# Patient Record
Sex: Male | Born: 1960 | Race: Black or African American | Hispanic: No | Marital: Single | Smoking: Never smoker
Health system: Southern US, Community
[De-identification: ages and names within clinical notes are randomized; demographics above are authoritative.]

## PROBLEM LIST (undated history)

## (undated) DIAGNOSIS — G40909 Epilepsy, unspecified, not intractable, without status epilepticus: Secondary | ICD-10-CM

## (undated) DIAGNOSIS — H547 Unspecified visual loss: Secondary | ICD-10-CM

## (undated) DIAGNOSIS — G809 Cerebral palsy, unspecified: Secondary | ICD-10-CM

## (undated) DIAGNOSIS — R569 Unspecified convulsions: Secondary | ICD-10-CM

## (undated) DIAGNOSIS — F79 Unspecified intellectual disabilities: Secondary | ICD-10-CM

## (undated) HISTORY — PX: COLONOSCOPY: SHX174

## (undated) HISTORY — PX: NO PAST SURGERIES: SHX2092

## (undated) HISTORY — PX: DENTAL SURGERY: SHX609

---

## 2009-10-26 ENCOUNTER — Ambulatory Visit: Payer: Self-pay | Admitting: Gastroenterology

## 2010-02-01 ENCOUNTER — Inpatient Hospital Stay: Payer: Self-pay | Admitting: Specialist

## 2010-12-27 ENCOUNTER — Emergency Department: Payer: Self-pay | Admitting: Unknown Physician Specialty

## 2012-02-15 ENCOUNTER — Ambulatory Visit: Payer: Self-pay | Admitting: Nurse Practitioner

## 2012-11-22 ENCOUNTER — Ambulatory Visit: Payer: Self-pay | Admitting: Otolaryngology

## 2013-03-25 ENCOUNTER — Emergency Department: Payer: Self-pay | Admitting: Emergency Medicine

## 2013-03-25 LAB — URINALYSIS, COMPLETE
Bilirubin,UR: NEGATIVE
Glucose,UR: NEGATIVE mg/dL (ref 0–75)
Nitrite: NEGATIVE
Ph: 9 (ref 4.5–8.0)
Protein: 30
Squamous Epithelial: 1
WBC UR: 1 /HPF (ref 0–5)

## 2013-03-25 LAB — CBC
HCT: 45.4 % (ref 40.0–52.0)
HGB: 15.5 g/dL (ref 13.0–18.0)
MCH: 32.4 pg (ref 26.0–34.0)
MCHC: 34.2 g/dL (ref 32.0–36.0)
MCV: 95 fL (ref 80–100)
RBC: 4.8 10*6/uL (ref 4.40–5.90)

## 2013-03-25 LAB — DIFFERENTIAL
Basophil #: 0 10*3/uL (ref 0.0–0.1)
Basophil %: 0.4 %
Eosinophil #: 0 10*3/uL (ref 0.0–0.7)
Eosinophil %: 0.6 %
Lymphocyte #: 1 10*3/uL (ref 1.0–3.6)
Monocyte %: 6.8 %
Neutrophil %: 56.8 %

## 2013-03-25 LAB — COMPREHENSIVE METABOLIC PANEL
Bilirubin,Total: 0.3 mg/dL (ref 0.2–1.0)
EGFR (Non-African Amer.): >60
Glucose: 99 mg/dL (ref 65–99)
SGOT(AST): 40 U/L — ABNORMAL HIGH (ref 15–37)
SGPT (ALT): 31 U/L (ref 12–78)
Sodium: 141 mmol/L (ref 136–145)

## 2013-03-25 LAB — PHENYTOIN LEVEL, TOTAL: Dilantin: 15.4 ug/mL (ref 10.0–20.0)

## 2013-08-25 ENCOUNTER — Emergency Department: Payer: Self-pay | Admitting: Emergency Medicine

## 2013-08-25 LAB — COMPREHENSIVE METABOLIC PANEL
ALK PHOS: 76 U/L
AST: 17 U/L (ref 15–37)
Albumin: 4.1 g/dL (ref 3.4–5.0)
Anion Gap: 5 — ABNORMAL LOW (ref 7–16)
BILIRUBIN TOTAL: 0.4 mg/dL (ref 0.2–1.0)
BUN: 12 mg/dL (ref 7–18)
CREATININE: 0.9 mg/dL (ref 0.60–1.30)
Calcium, Total: 8.9 mg/dL (ref 8.5–10.1)
Chloride: 105 mmol/L (ref 98–107)
Co2: 29 mmol/L (ref 21–32)
Glucose: 112 mg/dL — ABNORMAL HIGH (ref 65–99)
Osmolality: 278 (ref 275–301)
POTASSIUM: 3.7 mmol/L (ref 3.5–5.1)
SGPT (ALT): 25 U/L (ref 12–78)
Sodium: 139 mmol/L (ref 136–145)
Total Protein: 7.7 g/dL (ref 6.4–8.2)

## 2013-08-25 LAB — PHENYTOIN LEVEL, TOTAL: DILANTIN: 18.5 ug/mL (ref 10.0–20.0)

## 2013-08-25 LAB — CBC
HCT: 47.1 % (ref 40.0–52.0)
HGB: 15.1 g/dL (ref 13.0–18.0)
MCH: 30.8 pg (ref 26.0–34.0)
MCHC: 32 g/dL (ref 32.0–36.0)
MCV: 96 fL (ref 80–100)
Platelet: 182 10*3/uL (ref 150–440)
RBC: 4.9 10*6/uL (ref 4.40–5.90)
RDW: 13.3 % (ref 11.5–14.5)
WBC: 5.1 10*3/uL (ref 3.8–10.6)

## 2013-08-26 LAB — URINALYSIS, COMPLETE
BILIRUBIN, UR: NEGATIVE
Bacteria: NONE SEEN
GLUCOSE, UR: NEGATIVE mg/dL (ref 0–75)
Leukocyte Esterase: NEGATIVE
Nitrite: NEGATIVE
PH: 5 (ref 4.5–8.0)
SQUAMOUS EPITHELIAL: NONE SEEN
Specific Gravity: 1.032 (ref 1.003–1.030)
WBC UR: <1 /HPF (ref 0–5)

## 2014-02-24 ENCOUNTER — Emergency Department: Payer: Self-pay | Admitting: Emergency Medicine

## 2014-02-25 LAB — BASIC METABOLIC PANEL
Anion Gap: 2 — ABNORMAL LOW (ref 7–16)
BUN: 16 mg/dL (ref 7–18)
CREATININE: 0.94 mg/dL (ref 0.60–1.30)
Calcium, Total: 8.9 mg/dL (ref 8.5–10.1)
Chloride: 104 mmol/L (ref 98–107)
Co2: 34 mmol/L — ABNORMAL HIGH (ref 21–32)
GLUCOSE: 120 mg/dL — AB (ref 65–99)
Osmolality: 282 (ref 275–301)
Potassium: 3.7 mmol/L (ref 3.5–5.1)
SODIUM: 140 mmol/L (ref 136–145)

## 2014-02-25 LAB — URINALYSIS, COMPLETE
BILIRUBIN, UR: NEGATIVE
Blood: NEGATIVE
Glucose,UR: NEGATIVE mg/dL (ref 0–75)
Ketone: NEGATIVE
Leukocyte Esterase: NEGATIVE
Nitrite: NEGATIVE
Ph: 8 (ref 4.5–8.0)
Protein: NEGATIVE
RBC,UR: 4 /HPF (ref 0–5)
SPECIFIC GRAVITY: 1.014 (ref 1.003–1.030)
SQUAMOUS EPITHELIAL: NONE SEEN
WBC UR: NONE SEEN /HPF (ref 0–5)

## 2014-02-25 LAB — CBC
HCT: 45.3 % (ref 40.0–52.0)
HGB: 15.1 g/dL (ref 13.0–18.0)
MCH: 32 pg (ref 26.0–34.0)
MCHC: 33.4 g/dL (ref 32.0–36.0)
MCV: 96 fL (ref 80–100)
PLATELETS: 192 10*3/uL (ref 150–440)
RBC: 4.72 10*6/uL (ref 4.40–5.90)
RDW: 13.4 % (ref 11.5–14.5)
WBC: 8.1 10*3/uL (ref 3.8–10.6)

## 2014-02-25 LAB — PHENYTOIN LEVEL, TOTAL: DILANTIN: 15.5 ug/mL (ref 10.0–20.0)

## 2019-03-01 ENCOUNTER — Encounter
Admission: RE | Admit: 2019-03-01 | Discharge: 2019-03-01 | Disposition: A | Discharge status: Home / Self Care | Payer: Medicare Other | Source: Ambulatory Visit | Attending: Internal Medicine | Admitting: Internal Medicine

## 2019-03-01 ENCOUNTER — Other Ambulatory Visit: Payer: Self-pay

## 2019-03-01 DIAGNOSIS — Z20828 Contact with and (suspected) exposure to other viral communicable diseases: Secondary | ICD-10-CM | POA: Diagnosis not present

## 2019-03-01 DIAGNOSIS — Z01812 Encounter for preprocedural laboratory examination: Secondary | ICD-10-CM | POA: Diagnosis present

## 2019-03-01 LAB — SARS CORONAVIRUS 2 (TAT 6-24 HRS): SARS Coronavirus 2: NEGATIVE

## 2019-03-05 ENCOUNTER — Encounter: Payer: Self-pay | Admitting: *Deleted

## 2019-03-06 ENCOUNTER — Ambulatory Visit
Admission: RE | Admit: 2019-03-06 | Discharge: 2019-03-06 | Disposition: A | Discharge status: Home / Self Care | Payer: Medicare Other | Source: Ambulatory Visit | Attending: Internal Medicine | Admitting: Internal Medicine

## 2019-03-06 ENCOUNTER — Ambulatory Visit: Payer: Medicare Other | Admitting: Registered Nurse

## 2019-03-06 ENCOUNTER — Encounter
Admission: RE | Disposition: A | Discharge status: Home / Self Care | Payer: Self-pay | Source: Ambulatory Visit | Attending: Internal Medicine

## 2019-03-06 ENCOUNTER — Encounter: Payer: Self-pay | Admitting: *Deleted

## 2019-03-06 DIAGNOSIS — Z1211 Encounter for screening for malignant neoplasm of colon: Secondary | ICD-10-CM | POA: Insufficient documentation

## 2019-03-06 DIAGNOSIS — Z79899 Other long term (current) drug therapy: Secondary | ICD-10-CM | POA: Diagnosis not present

## 2019-03-06 DIAGNOSIS — G809 Cerebral palsy, unspecified: Secondary | ICD-10-CM | POA: Insufficient documentation

## 2019-03-06 DIAGNOSIS — G40909 Epilepsy, unspecified, not intractable, without status epilepticus: Secondary | ICD-10-CM | POA: Insufficient documentation

## 2019-03-06 HISTORY — DX: Cerebral palsy, unspecified: G80.9

## 2019-03-06 HISTORY — PX: COLONOSCOPY WITH PROPOFOL: SHX5780

## 2019-03-06 HISTORY — DX: Unspecified convulsions: R56.9

## 2019-03-06 HISTORY — DX: Epilepsy, unspecified, not intractable, without status epilepticus: G40.909

## 2019-03-06 HISTORY — DX: Unspecified visual loss: H54.7

## 2019-03-06 HISTORY — DX: Unspecified intellectual disabilities: F79

## 2019-03-06 SURGERY — COLONOSCOPY WITH PROPOFOL
Anesthesia: General

## 2019-03-06 MED ORDER — PROPOFOL 10 MG/ML IV BOLUS
INTRAVENOUS | Status: DC | PRN
  Administered 2019-03-06: 70 mg via INTRAVENOUS

## 2019-03-06 MED ORDER — EPHEDRINE SULFATE 50 MG/ML IJ SOLN
INTRAMUSCULAR | Status: DC | PRN
  Administered 2019-03-06: 10 mg via INTRAVENOUS

## 2019-03-06 MED ORDER — PROPOFOL 500 MG/50ML IV EMUL
INTRAVENOUS | Status: AC
  Filled 2019-03-06: qty 50

## 2019-03-06 MED ORDER — LIDOCAINE HCL (CARDIAC) PF 100 MG/5ML IV SOSY
PREFILLED_SYRINGE | INTRAVENOUS | Status: DC | PRN
  Administered 2019-03-06: 40 mg via INTRAVENOUS

## 2019-03-06 MED ORDER — SODIUM CHLORIDE 0.9 % IV SOLN
INTRAVENOUS | Status: DC
  Administered 2019-03-06: 13:00:00 via INTRAVENOUS

## 2019-03-06 MED ORDER — PROPOFOL 500 MG/50ML IV EMUL
INTRAVENOUS | Status: DC | PRN
  Administered 2019-03-06: 150 ug/kg/min via INTRAVENOUS

## 2019-03-06 NOTE — Op Note (Signed)
[facility] Gastroenterology Patient Name: Darius King Procedure Date: 03/06/2019 12:34 PM MRN: [id number] Account #: [id number] Date of Birth: 01-Mar-1961 Admit Type: Outpatient Age: 58 Room: [facility] ENDO ROOM 3 Gender: Male Note Status: Finalized Procedure:            Colonoscopy Indications:          Screening for colorectal malignant neoplasm Providers:            Boykin Nearing. Brier Firebaugh MD, MD Medicines:            Propofol per Anesthesia Complications:        No immediate complications. Procedure:            Pre-Anesthesia Assessment:                       - The risks and benefits of the procedure and the                        sedation options and risks were discussed with the                        patient. All questions were answered and informed                        consent was obtained.                       - Patient identification and proposed procedure were                        verified prior to the procedure by the nurse. The                        procedure was verified in the procedure room.                       - ASA Grade Assessment: III - A patient with severe                        systemic disease.                       - After reviewing the risks and benefits, the patient                        was deemed in satisfactory condition to undergo the                        procedure.                       After obtaining informed consent, the colonoscope was                        passed under direct vision. Throughout the procedure,                        the patient's blood pressure, pulse, and oxygen                        saturations were monitored continuously. The  Colonoscope was introduced through the anus and                        advanced to the the cecum, identified by appendiceal                        orifice and ileocecal valve. The colonoscopy was                        performed without difficulty. The patient  tolerated the                        procedure well. The quality of the bowel preparation                        was good except the descending colon was fair. The                        ileocecal valve, appendiceal orifice, and rectum were                        photographed. Findings:      The perianal and digital rectal examinations were normal. Pertinent       negatives include normal sphincter tone and no palpable rectal lesions.      Semi-liquid stool was found in the sigmoid colon and in the descending       colon, interfering with visualization. Lavage of the area was performed       using a moderate amount of tap water, resulting in clearance with fair       visualization.      The exam was otherwise without abnormality on direct and retroflexion       views. Impression:           - Stool in the sigmoid colon and in the descending                        colon.                       - The examination was otherwise normal on direct and                        retroflexion views.                       - No specimens collected. Recommendation:       - Patient has a contact number available for                        emergencies. The signs and symptoms of potential                        delayed complications were discussed with the patient.                        Return to normal activities tomorrow. Written discharge                        instructions were provided to the patient.                       -  Resume previous diet.                       - Continue present medications.                       - Repeat colonoscopy in 5 years for screening purposes.                       - Return to GI office PRN.                       - The findings and recommendations were discussed with                        the patient's family. Procedure Code(s):    --- Professional ---                       X9147, Colorectal cancer screening; colonoscopy on                        individual not  meeting criteria for high risk Diagnosis Code(s):    --- Professional ---                       Z12.11, Encounter for screening for malignant neoplasm                        of colon CPT copyright 2019 [employer]. All rights reserved. The codes documented in this report are preliminary and upon coder review may  be revised to meet current compliance requirements. Stanton Kidney MD, MD 03/06/2019 1:29:35 PM This report has been signed electronically. Number of Addenda: 0 Note Initiated On: 03/06/2019 12:34 PM Scope Withdrawal Time: 0 hours 9 minutes 56 seconds  Total Procedure Duration: 0 hours 17 minutes 27 seconds  Estimated Blood Loss: Estimated blood loss: none.      [facility]

## 2019-03-06 NOTE — Anesthesia Preprocedure Evaluation (Signed)
Anesthesia Evaluation  Patient identified by MRN, date of birth, ID band Patient awake and Patient confused    Reviewed: Allergy & Precautions, H&P , NPO status , Patient's Chart, lab work & pertinent test results  History of Anesthesia Complications Negative for: history of anesthetic complications  Airway Mallampati: III  TM Distance: >3 FB Neck ROM: limited    Dental  (+) Chipped, Poor Dentition, Missing   Pulmonary neg pulmonary ROS,           Cardiovascular (-) Past MI negative cardio ROS       Neuro/Psych Seizures -, Poorly Controlled,  PSYCHIATRIC DISORDERS    GI/Hepatic negative GI ROS, Neg liver ROS,   Endo/Other  negative endocrine ROS  Renal/GU negative Renal ROS  negative genitourinary   Musculoskeletal   Abdominal   Peds  Hematology negative hematology ROS (+)   Anesthesia Other Findings Past Medical History: No date: Blindness No date: Cerebral palsy ([name]) No date: Epilepsy ([place]) No date: Mentally challenged No date: Seizures ([place])  Past Surgical History: No date: COLONOSCOPY No date: DENTAL SURGERY No date: NO PAST SURGERIES  BMI    Body Mass Index: 29.29 kg/m      Reproductive/Obstetrics negative OB ROS                             Anesthesia Physical Anesthesia Plan  ASA: III  Anesthesia Plan: General   Post-op Pain Management:    Induction: Intravenous  PONV Risk Score and Plan: Propofol infusion and TIVA  Airway Management Planned: Natural Airway and Nasal Cannula  Additional Equipment:   Intra-op Plan:   Post-operative Plan:   Informed Consent: I have reviewed the patients History and Physical, chart, labs and discussed the procedure including the risks, benefits and alternatives for the proposed anesthesia with the patient or authorized representative who has indicated his/her understanding and acceptance.     Dental Advisory  Given  Plan Discussed with: Anesthesiologist, CRNA and Surgeon  Anesthesia Plan Comments: (Sister consented for risks of anesthesia including but not limited to:  - adverse reactions to medications - risk of intubation if required - damage to teeth, lips or other oral mucosa - sore throat or hoarseness - Damage to heart, brain, lungs or loss of life  Sister voiced understanding.)        Anesthesia Quick Evaluation

## 2019-03-06 NOTE — H&P (Signed)
Outpatient short stay form Pre-procedure 03/06/2019 10:37 AM Darius King, M.D.  Primary Physician: Salley Scarlet, M.D.  Reason for visit:  Colon cancer screening  History of present illness:  Patient is a 58 y/o male whose NOK is his sister. He has cerebral palsy with mental retardation and is unable to make his own decisions or discuss procedures. The sister gave consent to perform the colonoscopy scheduled today. The patient's sister understood the nature of the planned procedure, indications, risks, alternatives and potential complications including but not limited to bleeding, infection, perforation, damage to internal organs and possible oversedation/side effects from anesthesia.  She agrees and gives consent to proceed.  Please refer to procedure notes for findings, recommendations and patient disposition/instructions.   No current facility-administered medications for this encounter.   Current Outpatient Medications:  .  folic acid (FOLVITE) 1 MG tablet, Take 1 mg by mouth daily., Disp: , Rfl:  .  hydrocortisone 2.5 % cream, Apply topically 2 (two) times daily., Disp: , Rfl:  .  hydrocortisone 2.5 % cream, Apply topically 3 (three) times daily. Apply after bm, Disp: , Rfl:  .  lamoTRIgine (LAMICTAL) 100 MG tablet, Take 200 mg by mouth daily. and 250 mg at night, Disp: , Rfl:  .  LORazepam (ATIVAN) 1 MG tablet, Take 1 mg by mouth once as needed for anxiety (seizure)., Disp: , Rfl:  .  lovastatin (MEVACOR) 20 MG tablet, Take 20 mg by mouth daily., Disp: , Rfl:  .  phenytoin (DILANTIN) 100 MG ER capsule, Take by mouth 2 (two) times daily., Disp: , Rfl:   No medications prior to admission.     Not on File   Past Medical History:  Diagnosis Date  . Blindness   . Cerebral palsy ([place])   . Epilepsy ([place])   . Mentally challenged   . Seizures ([place])     Review of systems:  Otherwise negative.    Physical Exam  Gen: Alert, oriented. Appears stated age.  HEENT:  [place]/AT. PERRLA. Lungs: CTA, no wheezes. CV: RR nl S1, S2. Abd: soft, benign, no masses. BS+ Ext: No edema. Pulses 2+    Planned procedures: Proceed with colonoscopy. The patient understands the nature of the planned procedure, indications, risks, alternatives and potential complications including but not limited to bleeding, infection, perforation, damage to internal organs and possible oversedation/side effects from anesthesia. The patient agrees and gives consent to proceed.  Please refer to procedure notes for findings, recommendations and patient disposition/instructions.     Darius King, M.D. Gastroenterology 03/06/2019  10:37 AM

## 2019-03-06 NOTE — Transfer of Care (Signed)
Immediate Anesthesia Transfer of Care Note  Patient: Darius King  Procedure(s) Performed: Procedure(s): COLONOSCOPY WITH PROPOFOL (N/A)  Patient Location: PACU and Endoscopy Unit  Anesthesia Type:General  Level of Consciousness: sedated  Airway & Oxygen Therapy: Patient Spontanous Breathing and Patient connected to nasal cannula oxygen  Post-op Assessment: Report given to RN and Post -op Vital signs reviewed and stable  Post vital signs: Reviewed and stable  Last Vitals:  Vitals:   03/06/19 1330 03/06/19 1331  BP: (!) 79/47 104/67  Pulse: 76 78  Resp: 18 17  Temp:    SpO2: 62% 03%    Complications: No apparent anesthesia complications

## 2019-03-06 NOTE — Anesthesia Procedure Notes (Signed)
Date/Time: 03/06/2019 1:00 PM Performed by: Doreen Salvage, CRNA Pre-anesthesia Checklist: Patient identified, Emergency Drugs available, Suction available and Patient being monitored Patient Re-evaluated:Patient Re-evaluated prior to induction Oxygen Delivery Method: Nasal cannula Induction Type: IV induction Dental Injury: Teeth and Oropharynx as per pre-operative assessment  Comments: Nasal cannula with etCO2 monitoring

## 2019-03-06 NOTE — Anesthesia Post-op Follow-up Note (Signed)
Anesthesia QCDR form completed.        

## 2019-03-07 ENCOUNTER — Encounter: Payer: Self-pay | Admitting: Internal Medicine

## 2019-03-07 NOTE — Anesthesia Postprocedure Evaluation (Signed)
Anesthesia Post Note  Patient: Darius King  Procedure(s) Performed: COLONOSCOPY WITH PROPOFOL (N/A )  Patient location during evaluation: Endoscopy Anesthesia Type: General Level of consciousness: awake and alert Pain management: pain level controlled Vital Signs Assessment: post-procedure vital signs reviewed and stable Respiratory status: spontaneous breathing, nonlabored ventilation, respiratory function stable and patient connected to nasal cannula oxygen Cardiovascular status: blood pressure returned to baseline and stable Postop Assessment: no apparent nausea or vomiting Anesthetic complications: no     Last Vitals:  Vitals:   03/06/19 1331 03/06/19 1340  BP: 104/67 120/71  Pulse: 78 79  Resp: 17 18  Temp:  (!) 36.4 C  SpO2: 99% 100%    Last Pain:  Vitals:   03/06/19 1340  TempSrc: Tympanic                 [name]

## 2021-02-19 ENCOUNTER — Other Ambulatory Visit: Payer: Self-pay | Admitting: Neurology

## 2021-02-19 DIAGNOSIS — R569 Unspecified convulsions: Secondary | ICD-10-CM

## 2021-03-15 ENCOUNTER — Other Ambulatory Visit: Payer: Self-pay

## 2021-03-15 ENCOUNTER — Ambulatory Visit
Admission: RE | Admit: 2021-03-15 | Discharge: 2021-03-15 | Disposition: A | Discharge status: Home / Self Care | Payer: Medicare Other | Source: Ambulatory Visit | Attending: Neurology | Admitting: Neurology

## 2021-03-15 DIAGNOSIS — R569 Unspecified convulsions: Secondary | ICD-10-CM | POA: Insufficient documentation

## 2023-05-05 IMAGING — CT CT HEAD W/O CM
1 series · 15 of 28 positions shown, 19 images · non-contrast
Comparison: CT head without contrast 08/26/2013.

CLINICAL DATA: Seizures

EXAM:
CT HEAD WITHOUT CONTRAST
TECHNIQUE: Contiguous axial images were obtained from the base of the skull
through the vertex without intravenous contrast.

[Series 2: head wo · axial · 0.39mm/px · z∈[-98,+27]mm · 15 of 28 slices shown, 19 images]
[im 2/28  brain]
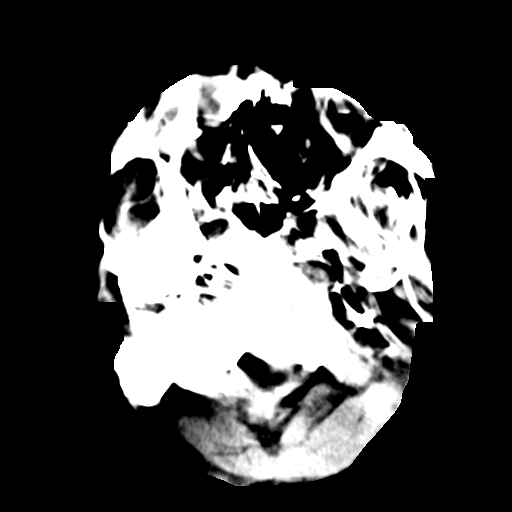
[im 2/28  bone]
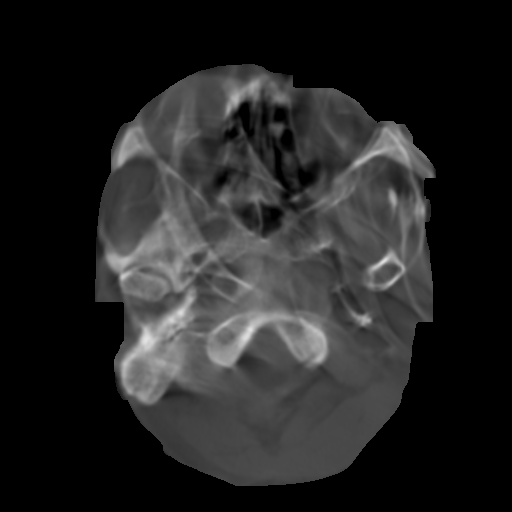
[im 4/28  brain]
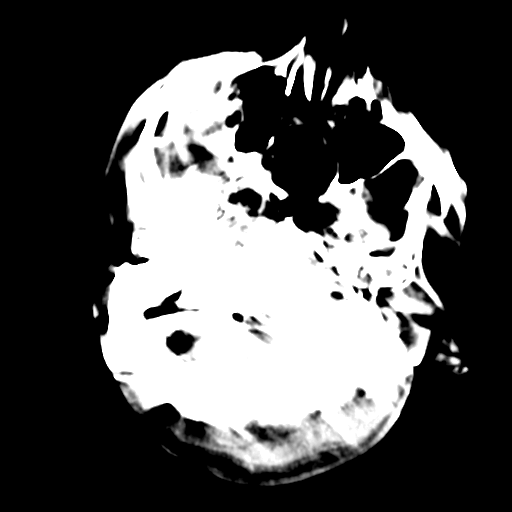
[im 6/28  brain]
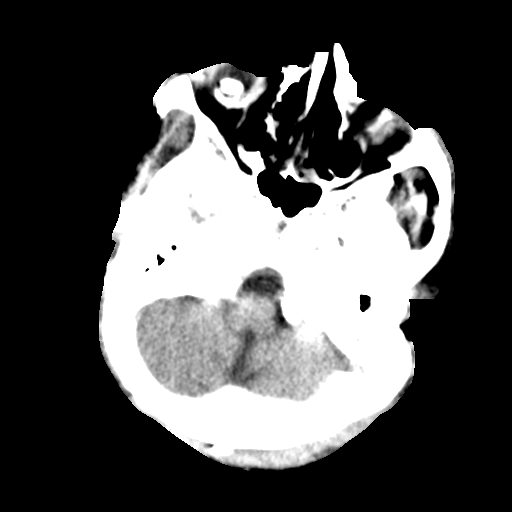
[im 8/28  brain]
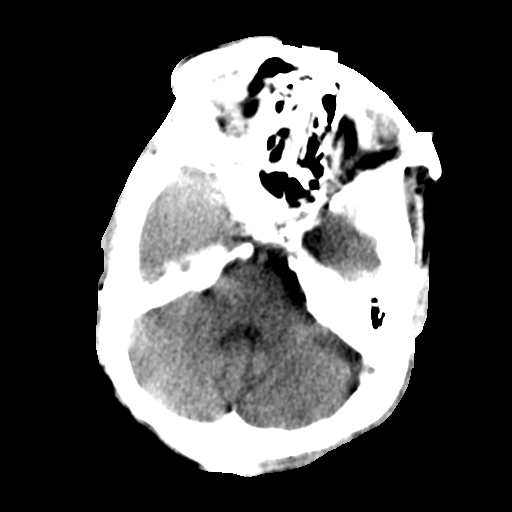
[im 9/28  brain]
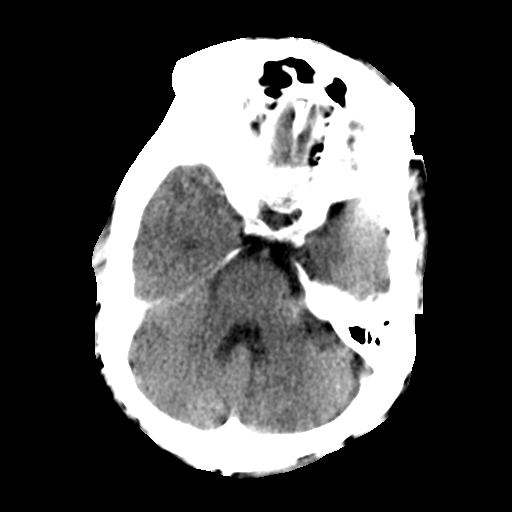
[im 9/28  bone]
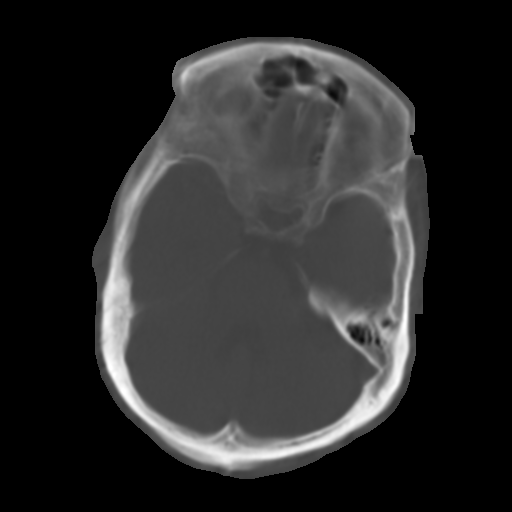
[im 11/28  brain]
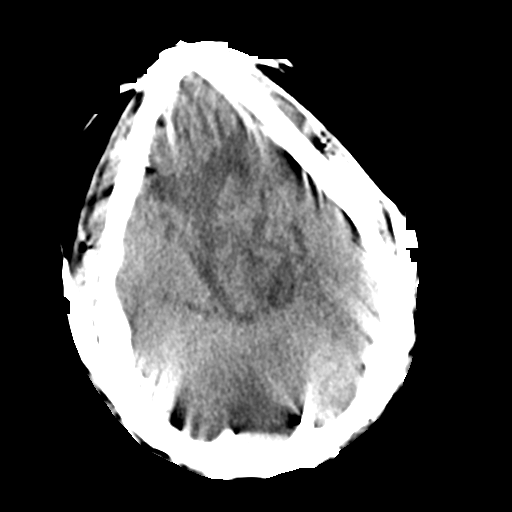
[im 13/28  brain]
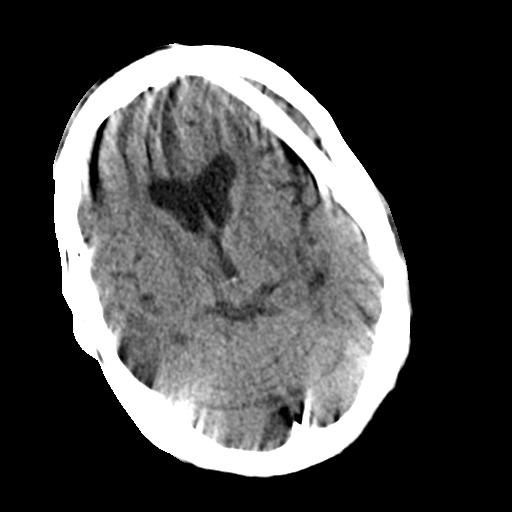
[im 15/28  brain]
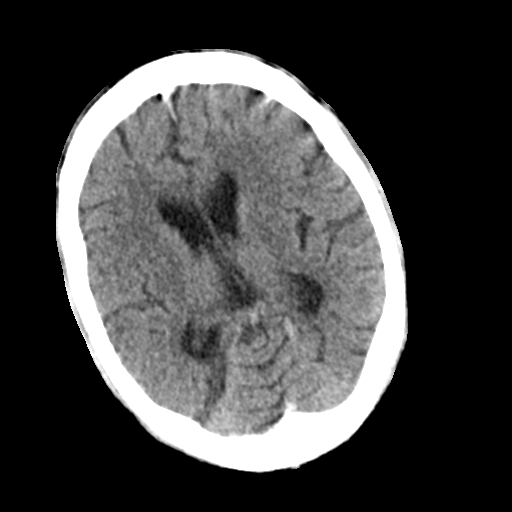
[im 16/28  brain]
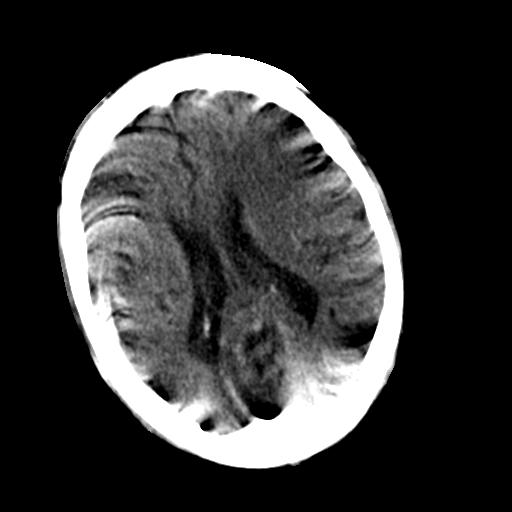
[im 16/28  bone]
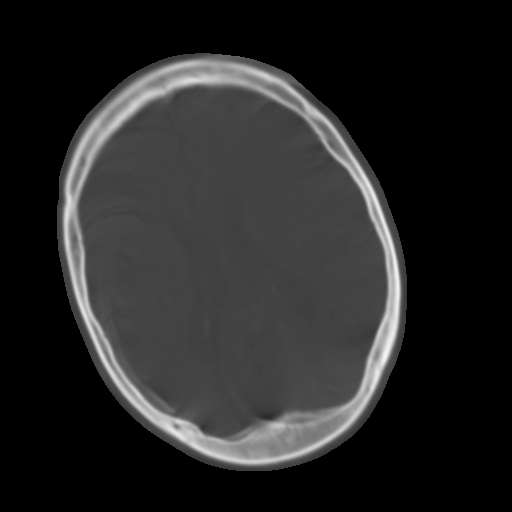
[im 18/28  brain]
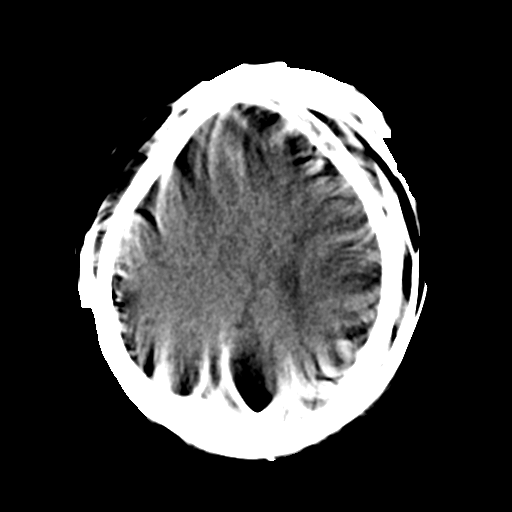
[im 20/28  brain]
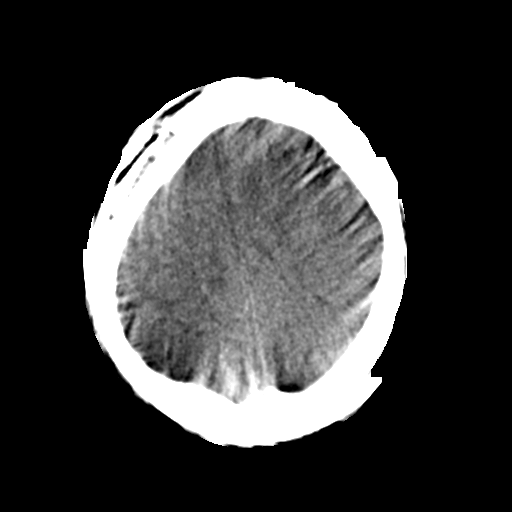
[im 21/28  brain]
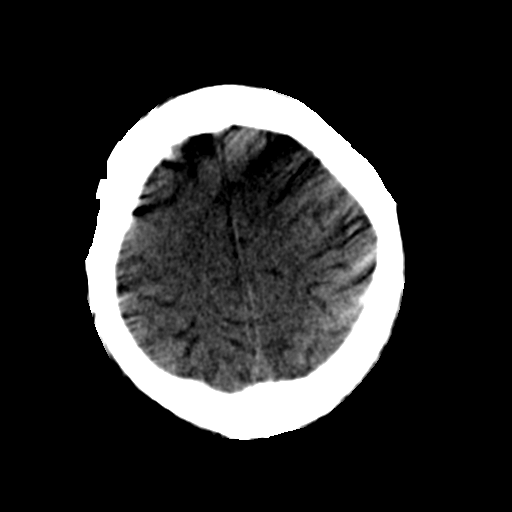
[im 23/28  brain]
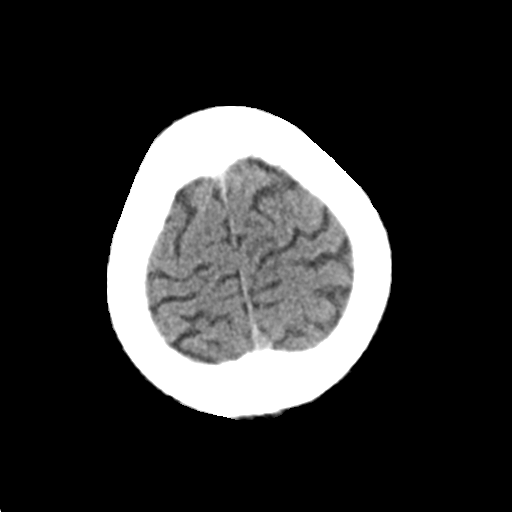
[im 23/28  bone]
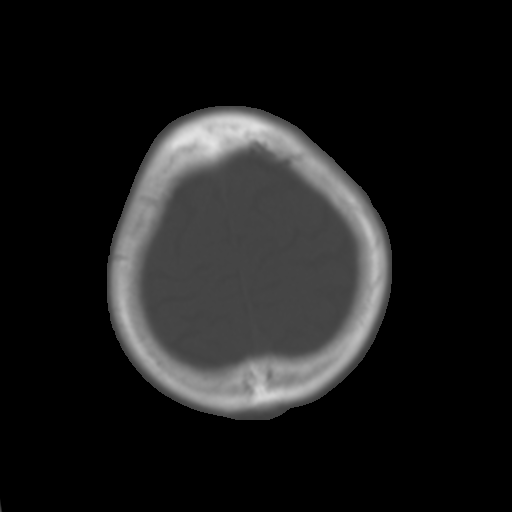
[im 25/28  brain]
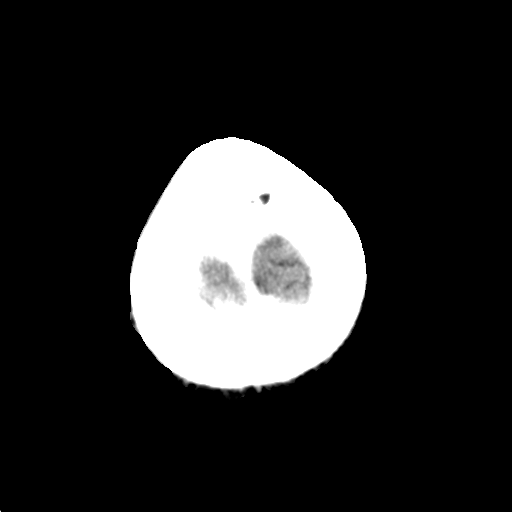
[im 27/28  brain]
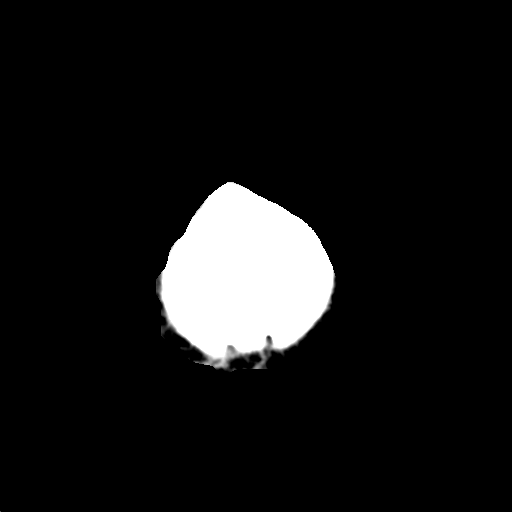

[15 of 28 positions shown; findings below may reference images not displayed]

FINDINGS: Brain: Despite medication immobilization attempts, there is
significant movement throughout the exam. Significant portions the
brain are poorly visualized as a result. Patient could not tolerate
repeat images.

Visualized portions the brain are unremarkable. No acute infarct,
hemorrhage, or mass lesion is present. Ventricles are of normal
size. No significant extra-axial fluid collection is present.
Brainstem and cerebellum are unremarkable.

Vascular: No hyperdense vessel or unexpected calcification.

Skull: Acute or focal abnormalities are evident. Visualization is
degraded by motion. No significant extracranial soft tissue lesion
is present.

Sinuses/Orbits: Calcifications are noted in the globes bilaterally.
Mucosal thickening is present throughout the posterior ethmoid air
cells. A small fluid level is present in the right maxillary sinus.
No other fluid levels are present.
IMPRESSION: 1. Significantly motion degraded exam.
2. Visible portions of the brain are unremarkable. No acute or focal
lesion to seizures.
3. Mild sinus disease.

## 2023-08-02 ENCOUNTER — Emergency Department: Payer: Medicare Other

## 2023-08-02 ENCOUNTER — Inpatient Hospital Stay
Admission: EM | Admit: 2023-08-02 | Discharge: 2023-08-06 | DRG: 177 | Disposition: A | Discharge status: Home / Self Care | Payer: Medicare Other | Attending: Internal Medicine | Admitting: Internal Medicine

## 2023-08-02 ENCOUNTER — Other Ambulatory Visit: Payer: Self-pay

## 2023-08-02 DIAGNOSIS — E785 Hyperlipidemia, unspecified: Secondary | ICD-10-CM | POA: Diagnosis present

## 2023-08-02 DIAGNOSIS — J101 Influenza due to other identified influenza virus with other respiratory manifestations: Secondary | ICD-10-CM

## 2023-08-02 DIAGNOSIS — G809 Cerebral palsy, unspecified: Secondary | ICD-10-CM | POA: Diagnosis present

## 2023-08-02 DIAGNOSIS — Z79899 Other long term (current) drug therapy: Secondary | ICD-10-CM

## 2023-08-02 DIAGNOSIS — J9601 Acute respiratory failure with hypoxia: Secondary | ICD-10-CM | POA: Diagnosis present

## 2023-08-02 DIAGNOSIS — J690 Pneumonitis due to inhalation of food and vomit: Secondary | ICD-10-CM | POA: Diagnosis present

## 2023-08-02 DIAGNOSIS — I700 Atherosclerosis of aorta: Secondary | ICD-10-CM | POA: Diagnosis present

## 2023-08-02 DIAGNOSIS — A419 Sepsis, unspecified organism: Principal | ICD-10-CM

## 2023-08-02 DIAGNOSIS — G40909 Epilepsy, unspecified, not intractable, without status epilepticus: Secondary | ICD-10-CM

## 2023-08-02 LAB — CBC WITH DIFFERENTIAL/PLATELET
Abs Immature Granulocytes: 0.03 10*3/uL (ref 0.00–0.07)
Basophils Absolute: 0 10*3/uL (ref 0.0–0.1)
Basophils Relative: 0 %
Eosinophils Absolute: 0 10*3/uL (ref 0.0–0.5)
Eosinophils Relative: 0 %
HCT: 42.4 % (ref 39.0–52.0)
Hemoglobin: 14.4 g/dL (ref 13.0–17.0)
Immature Granulocytes: 1 %
Lymphocytes Relative: 7 %
Lymphs Abs: 0.4 10*3/uL — ABNORMAL LOW (ref 0.7–4.0)
MCH: 32.5 pg (ref 26.0–34.0)
MCHC: 34 g/dL (ref 30.0–36.0)
MCV: 95.7 fL (ref 80.0–100.0)
Monocytes Absolute: 0.7 10*3/uL (ref 0.1–1.0)
Monocytes Relative: 11 %
Neutro Abs: 5.1 10*3/uL (ref 1.7–7.7)
Neutrophils Relative %: 81 %
Platelets: 175 10*3/uL (ref 150–400)
RBC: 4.43 MIL/uL (ref 4.22–5.81)
RDW: 12.9 % (ref 11.5–15.5)
WBC: 6.2 10*3/uL (ref 4.0–10.5)
nRBC: 0 % (ref 0.0–0.2)

## 2023-08-02 LAB — BASIC METABOLIC PANEL
Anion gap: 11 (ref 5–15)
BUN: 23 mg/dL (ref 8–23)
CO2: 24 mmol/L (ref 22–32)
Calcium: 8.8 mg/dL — ABNORMAL LOW (ref 8.9–10.3)
Chloride: 106 mmol/L (ref 98–111)
Creatinine, Ser: 0.88 mg/dL (ref 0.61–1.24)
GFR, Estimated: >60 mL/min (ref >60)
Glucose, Bld: 147 mg/dL — ABNORMAL HIGH (ref 70–99)
Potassium: 3.9 mmol/L (ref 3.5–5.1)
Sodium: 141 mmol/L (ref 135–145)

## 2023-08-02 LAB — HEPATIC FUNCTION PANEL
ALT: 21 U/L (ref 0–44)
AST: 31 U/L (ref 15–41)
Albumin: 4.1 g/dL (ref 3.5–5.0)
Alkaline Phosphatase: 58 U/L (ref 38–126)
Bilirubin, Direct: <0.1 mg/dL (ref 0.0–0.2)
Total Bilirubin: 0.5 mg/dL (ref 0.0–1.2)
Total Protein: 7 g/dL (ref 6.5–8.1)

## 2023-08-02 LAB — RESP PANEL BY RT-PCR (RSV, FLU A&B, COVID)  RVPGX2
Influenza A by PCR: POSITIVE — AB
Influenza B by PCR: NEGATIVE
Resp Syncytial Virus by PCR: NEGATIVE
SARS Coronavirus 2 by RT PCR: NEGATIVE

## 2023-08-02 LAB — LACTIC ACID, PLASMA: Lactic Acid, Venous: 1.8 mmol/L (ref 0.5–1.9)

## 2023-08-02 LAB — PHENYTOIN LEVEL, TOTAL: Phenytoin Lvl: 21.7 ug/mL — ABNORMAL HIGH (ref 10.0–20.0)

## 2023-08-02 MED ORDER — FOLIC ACID 1 MG PO TABS
1.0000 mg | ORAL_TABLET | Freq: Every day | ORAL | Status: DC
  Administered 2023-08-03 – 2023-08-06 (×4): 1 mg via ORAL
  Filled 2023-08-02 (×4): qty 1

## 2023-08-02 MED ORDER — MAGNESIUM HYDROXIDE 400 MG/5ML PO SUSP
30.0000 mL | Freq: Every day | ORAL | Status: DC | PRN
Start: 2023-08-02 — End: 2023-08-06

## 2023-08-02 MED ORDER — SODIUM CHLORIDE 0.9 % IV SOLN: 2.0000 g | INTRAVENOUS | Status: DC

## 2023-08-02 MED ORDER — PHENYTOIN SODIUM EXTENDED 100 MG PO CAPS
100.0000 mg | ORAL_CAPSULE | Freq: Two times a day (BID) | ORAL | Status: DC
Start: 2023-08-02 — End: 2023-08-06
  Administered 2023-08-03 – 2023-08-06 (×7): 100 mg via ORAL
  Filled 2023-08-02 (×7): qty 1

## 2023-08-02 MED ORDER — ACETAMINOPHEN 10 MG/ML IV SOLN
1000.0000 mg | Freq: Once | INTRAVENOUS | Status: AC
  Administered 2023-08-03: 1000 mg via INTRAVENOUS
  Filled 2023-08-02: qty 100

## 2023-08-02 MED ORDER — OSELTAMIVIR PHOSPHATE 75 MG PO CAPS
75.0000 mg | ORAL_CAPSULE | Freq: Two times a day (BID) | ORAL | Status: DC
  Administered 2023-08-03 – 2023-08-06 (×8): 75 mg via ORAL
  Filled 2023-08-02 (×8): qty 1

## 2023-08-02 MED ORDER — ONDANSETRON HCL 4 MG PO TABS: 4.0000 mg | ORAL_TABLET | Freq: Four times a day (QID) | ORAL | Status: DC | PRN

## 2023-08-02 MED ORDER — ENOXAPARIN SODIUM 40 MG/0.4ML IJ SOSY
40.0000 mg | PREFILLED_SYRINGE | INTRAMUSCULAR | Status: DC
  Administered 2023-08-03 – 2023-08-05 (×3): 40 mg via SUBCUTANEOUS
  Filled 2023-08-02 (×3): qty 0.4

## 2023-08-02 MED ORDER — METRONIDAZOLE 500 MG/100ML IV SOLN
500.0000 mg | Freq: Once | INTRAVENOUS | Status: AC
  Administered 2023-08-02: 500 mg via INTRAVENOUS
  Filled 2023-08-02: qty 100

## 2023-08-02 MED ORDER — METRONIDAZOLE 500 MG/100ML IV SOLN: 500.0000 mg | Freq: Two times a day (BID) | INTRAVENOUS | Status: DC

## 2023-08-02 MED ORDER — IPRATROPIUM-ALBUTEROL 0.5-2.5 (3) MG/3ML IN SOLN
3.0000 mL | Freq: Four times a day (QID) | RESPIRATORY_TRACT | Status: DC
  Administered 2023-08-02 – 2023-08-04 (×7): 3 mL via RESPIRATORY_TRACT
  Filled 2023-08-02 (×7): qty 3

## 2023-08-02 MED ORDER — SODIUM CHLORIDE 0.9 % IV SOLN
500.0000 mg | Freq: Once | INTRAVENOUS | Status: AC
  Administered 2023-08-02: 500 mg via INTRAVENOUS
  Filled 2023-08-02: qty 5

## 2023-08-02 MED ORDER — ACETAMINOPHEN 325 MG PO TABS: 650.0000 mg | ORAL_TABLET | Freq: Four times a day (QID) | ORAL | Status: DC | PRN

## 2023-08-02 MED ORDER — IOHEXOL 300 MG/ML  SOLN
75.0000 mL | Freq: Once | INTRAMUSCULAR | Status: AC | PRN
  Administered 2023-08-02: 75 mL via INTRAVENOUS

## 2023-08-02 MED ORDER — ACETAMINOPHEN 325 MG PO TABS: 650.0000 mg | ORAL_TABLET | Freq: Once | ORAL | Status: AC

## 2023-08-02 MED ORDER — LAMOTRIGINE 100 MG PO TABS
200.0000 mg | ORAL_TABLET | Freq: Every day | ORAL | Status: DC
Start: 2023-08-02 — End: 2023-08-03

## 2023-08-02 MED ORDER — PRAVASTATIN SODIUM 20 MG PO TABS
20.0000 mg | ORAL_TABLET | Freq: Every day | ORAL | Status: DC
  Administered 2023-08-03 – 2023-08-05 (×3): 20 mg via ORAL
  Filled 2023-08-02 (×3): qty 1

## 2023-08-02 MED ORDER — MELATONIN 5 MG PO TABS
2.5000 mg | ORAL_TABLET | Freq: Every day | ORAL | Status: DC
  Administered 2023-08-03 – 2023-08-05 (×3): 2.5 mg via ORAL
  Filled 2023-08-02 (×3): qty 1

## 2023-08-02 MED ORDER — SODIUM CHLORIDE 0.9 % IV SOLN
2.0000 g | Freq: Once | INTRAVENOUS | Status: AC
  Administered 2023-08-02: 2 g via INTRAVENOUS
  Filled 2023-08-02: qty 20

## 2023-08-02 MED ORDER — ACETAMINOPHEN 500 MG PO TABS
ORAL_TABLET | ORAL | Status: AC
Start: 2023-08-02 — End: 2023-08-02
  Administered 2023-08-02: 650 mg via ORAL
  Filled 2023-08-02: qty 2

## 2023-08-02 MED ORDER — ONDANSETRON HCL 4 MG/2ML IJ SOLN: 4.0000 mg | Freq: Four times a day (QID) | INTRAMUSCULAR | Status: DC | PRN

## 2023-08-02 MED ORDER — LACTATED RINGERS IV SOLN: INTRAVENOUS | Status: DC

## 2023-08-02 MED ORDER — HYDROCOD POLI-CHLORPHE POLI ER 10-8 MG/5ML PO SUER
5.0000 mL | Freq: Two times a day (BID) | ORAL | Status: DC | PRN
  Administered 2023-08-06: 5 mL via ORAL
  Filled 2023-08-02: qty 5

## 2023-08-02 MED ORDER — LACTATED RINGERS IV BOLUS (SEPSIS)
1000.0000 mL | Freq: Once | INTRAVENOUS | Status: AC
  Administered 2023-08-02: 1000 mL via INTRAVENOUS

## 2023-08-02 MED ORDER — LACTATED RINGERS IV SOLN
150.0000 mL/h | INTRAVENOUS | Status: AC
Start: 2023-08-02 — End: 2023-08-03
  Administered 2023-08-02: 150 mL/h via INTRAVENOUS

## 2023-08-02 MED ORDER — LORAZEPAM 2 MG/ML IJ SOLN
1.0000 mg | INTRAMUSCULAR | Status: DC | PRN
  Filled 2023-08-02: qty 1

## 2023-08-02 MED ORDER — GUAIFENESIN ER 600 MG PO TB12
600.0000 mg | ORAL_TABLET | Freq: Two times a day (BID) | ORAL | Status: DC
  Administered 2023-08-02 – 2023-08-06 (×7): 600 mg via ORAL
  Filled 2023-08-02 (×5): qty 1

## 2023-08-02 MED ORDER — ACETAMINOPHEN 650 MG RE SUPP: 650.0000 mg | Freq: Four times a day (QID) | RECTAL | Status: DC | PRN

## 2023-08-02 MED ORDER — TRAZODONE HCL 50 MG PO TABS
25.0000 mg | ORAL_TABLET | Freq: Every evening | ORAL | Status: DC | PRN
  Administered 2023-08-03 – 2023-08-05 (×3): 25 mg via ORAL
  Filled 2023-08-02 (×3): qty 1

## 2023-08-02 MED ORDER — SODIUM CHLORIDE 0.9 % IV SOLN
500.0000 mg | INTRAVENOUS | Status: DC
  Administered 2023-08-03: 500 mg via INTRAVENOUS
  Filled 2023-08-02: qty 5

## 2023-08-02 NOTE — Assessment & Plan Note (Signed)
Patient requiring up to 2 L of oxygen with no baseline oxygen use. Likely with influenza and aspiration pneumonia. -Continue supplemental oxygen-wean as tolerated

## 2023-08-02 NOTE — ED Provider Notes (Signed)
[facility] Provider Note   Event Date/Time   First MD Initiated Contact with Patient 08/02/23 1824     (approximate) History  Seizures, Cough, and Nasal Congestion  HPI Darius King is a 63 y.o. male with a past medical history of epilepsy and nonverbal at baseline who presents after an episode of seizure-like activity at home and cough as well as congestion and fever starting today.  Caregiver who is at bedside states that patient has not missed any doses of his normal antiepileptic medications.  Family were states that she heard a banging on the wall and came in to see him with seizure-like activity similar to previous episodes of seizures.  Patient has not had any further seizures since the first ROS: Unable to assess   Physical Exam  Triage Vital Signs: ED Triage Vitals  Encounter Vitals Group     BP 08/02/23 1655 102/60     Systolic BP Percentile --      Diastolic BP Percentile --      Pulse Rate 08/02/23 1655 97     Resp 08/02/23 1655 20     Temp 08/02/23 1655 (!) 102 F (38.9 C)     Temp Source 08/02/23 1655 Axillary     SpO2 08/02/23 1654 99 %     Weight --      Height --      Head Circumference --      Peak Flow --      Pain Score --      Pain Loc --      Pain Education --      Exclude from Growth Chart --    Most recent vital signs: Vitals:   08/02/23 2247 08/02/23 2300  BP:  105/66  Pulse:  97  Resp:  (!) 28  Temp: (!) 103 F (39.4 C)   SpO2:  100%   General: Awake, uncooperative CV:  Good peripheral perfusion.  Resp:  Normal effort.  Rales over bilateral lung fields.  2 L nasal cannula in place Abd:  No distention.  Other:  Elderly well-developed, well-nourished African-American male resting in stretcher with eyes closed ED Results / Procedures / Treatments  Labs (all labs ordered are listed, but only abnormal results are displayed) Labs Reviewed  RESP PANEL BY RT-PCR (RSV, FLU A&B, COVID)  RVPGX2 - Abnormal; Notable for  the following components:      Result Value   Influenza A by PCR POSITIVE (*)    All other components within normal limits  CBC WITH DIFFERENTIAL/PLATELET - Abnormal; Notable for the following components:   Lymphs Abs 0.4 (*)    All other components within normal limits  BASIC METABOLIC PANEL - Abnormal; Notable for the following components:   Glucose, Bld 147 (*)    Calcium 8.8 (*)    All other components within normal limits  PHENYTOIN LEVEL, TOTAL - Abnormal; Notable for the following components:   Phenytoin Lvl 21.7 (*)    All other components within normal limits  CULTURE, BLOOD (ROUTINE X 2)  CULTURE, BLOOD (ROUTINE X 2)  LACTIC ACID, PLASMA  HEPATIC FUNCTION PANEL  LAMOTRIGINE LEVEL  PROTIME-INR  CORTISOL-AM, BLOOD  BASIC METABOLIC PANEL  CBC  HIV ANTIBODY (ROUTINE TESTING W REFLEX)   RADIOLOGY ED MD interpretation: CT of the chest with contrast shows minimal patchy groundglass in the lower lobes with endobronchial debris suggesting aspiration bronchopneumonia Single view portable x-ray of the chest shows a 1.1 cm long-axis nodular density  over the left midlung as well as mid and lower thoracic spondylosis -Agree with radiology assessment Official radiology report(s): CT Chest W Contrast Result Date: 08/02/2023 CLINICAL DATA:  Seizure, cough, congestion and fever. EXAM: CT CHEST WITH CONTRAST TECHNIQUE: Multidetector CT imaging of the chest was performed during intravenous contrast administration. RADIATION DOSE REDUCTION: This exam was performed according to the departmental dose-optimization program which includes automated exposure control, adjustment of the mA and/or kV according to patient size and/or use of iterative reconstruction technique. CONTRAST:  75mL OMNIPAQUE IOHEXOL 300 MG/ML  SOLN COMPARISON:  None Available. FINDINGS: Cardiovascular: Atherosclerotic calcification of the aorta. Heart size normal. No pericardial effusion. Mediastinum/Nodes: No pathologically  enlarged mediastinal, hilar or axillary lymph nodes. Air in the esophagus can be seen with dysmotility. Lungs/Pleura: Image quality is degraded by respiratory motion. Minimal patchy ground-glass and endobronchial debris in the lower lobes. No pleural fluid. Airway is unremarkable. Upper Abdomen: Visualized portions of the liver, gallbladder, adrenal glands, kidneys, spleen, pancreas, stomach and bowel are grossly unremarkable. No upper abdominal adenopathy. Musculoskeletal: Degenerative changes in the spine. IMPRESSION: 1. Minimal patchy ground-glass in the lower lobes with endobronchial debris, findings which can be seen with aspiration. Bronchopneumonia not excluded. 2.  Aortic atherosclerosis (ICD10-I70.0). Electronically Signed   By: Leanna Battles M.D.   On: 08/02/2023 20:02   DG Chest Port 1 View Result Date: 08/02/2023 CLINICAL DATA:  Prior seizure.  Cough and congestion.  Fever. EXAM: PORTABLE CHEST 1 VIEW COMPARISON:  12/27/2010 FINDINGS: The patient is rotated to the right on today's radiograph, reducing diagnostic sensitivity and specificity. A 1.1 cm in long axis nodular density projects over the left mid lung. Pulmonary neoplasm not excluded, chest CT recommended for definitive characterization. Lungs appear otherwise clear. No blunting of the costophrenic angles. Cardiac and mediastinal margins appear normal. Mid and lower thoracic spondylosis. No appreciable glenohumeral malalignment. IMPRESSION: 1. A 1.1 cm in long axis nodular density projects over the left mid lung. Pulmonary neoplasm not excluded, chest CT recommended for definitive characterization. 2. Mid and lower thoracic spondylosis. Electronically Signed   By: Gaylyn Rong M.D.   On: 08/02/2023 18:59   PROCEDURES: Critical Care performed: Yes, see critical care procedure note(s) .1-3 Lead EKG Interpretation  Performed by: Merwyn Katos, MD Authorized by: Merwyn Katos, MD     Interpretation: normal     ECG rate:  91    ECG rate assessment: normal     Rhythm: sinus rhythm     Ectopy: none     Conduction: normal   CRITICAL CARE Performed by: Merwyn Katos  Total critical care time: 35 minutes  Critical care time was exclusive of separately billable procedures and treating other patients.  Critical care was necessary to treat or prevent imminent or life-threatening deterioration.  Critical care was time spent personally by me on the following activities: development of treatment plan with patient and/or surrogate as well as nursing, discussions with consultants, evaluation of patient's response to treatment, examination of patient, obtaining history from patient or surrogate, ordering and performing treatments and interventions, ordering and review of laboratory studies, ordering and review of radiographic studies, pulse oximetry and re-evaluation of patient's condition.  MEDICATIONS ORDERED IN ED: Medications  pravastatin (PRAVACHOL) tablet 20 mg (has no administration in time range)  folic acid (FOLVITE) tablet 1 mg (has no administration in time range)  melatonin tablet 2.5 mg (2.5 mg Oral Not Given 08/02/23 2224)  lamoTRIgine (LAMICTAL) tablet 200 mg (200 mg Oral Not Given  08/02/23 2224)  phenytoin (DILANTIN) ER capsule 100 mg (100 mg Oral Not Given 08/02/23 2226)  lactated ringers infusion (150 mL/hr Intravenous New Bag/Given 08/02/23 2226)  enoxaparin (LOVENOX) injection 40 mg (has no administration in time range)  cefTRIAXone (ROCEPHIN) 2 g in sodium chloride 0.9 % 100 mL IVPB (has no administration in time range)  azithromycin (ZITHROMAX) 500 mg in sodium chloride 0.9 % 250 mL IVPB (has no administration in time range)  acetaminophen (TYLENOL) tablet 650 mg (has no administration in time range)    Or  acetaminophen (TYLENOL) suppository 650 mg (has no administration in time range)  traZODone (DESYREL) tablet 25 mg (has no administration in time range)  magnesium hydroxide (MILK OF MAGNESIA)  suspension 30 mL (has no administration in time range)  ondansetron (ZOFRAN) tablet 4 mg (has no administration in time range)    Or  ondansetron (ZOFRAN) injection 4 mg (has no administration in time range)  metroNIDAZOLE (FLAGYL) IVPB 500 mg (has no administration in time range)  guaiFENesin (MUCINEX) 12 hr tablet 600 mg (600 mg Oral Given 08/02/23 2236)  chlorpheniramine-HYDROcodone (TUSSIONEX) 10-8 MG/5ML suspension 5 mL (has no administration in time range)  ipratropium-albuterol (DUONEB) 0.5-2.5 (3) MG/3ML nebulizer solution 3 mL (3 mLs Nebulization Given 08/02/23 2235)  oseltamivir (TAMIFLU) capsule 75 mg (has no administration in time range)  acetaminophen (OFIRMEV) IV 1,000 mg (has no administration in time range)  LORazepam (ATIVAN) injection 1 mg (has no administration in time range)  iohexol (OMNIPAQUE) 300 MG/ML solution 75 mL (75 mLs Intravenous Contrast Given 08/02/23 1953)  lactated ringers bolus 1,000 mL (0 mLs Intravenous Stopped 08/02/23 2223)  cefTRIAXone (ROCEPHIN) 2 g in sodium chloride 0.9 % 100 mL IVPB (0 g Intravenous Stopped 08/02/23 2135)  azithromycin (ZITHROMAX) 500 mg in sodium chloride 0.9 % 250 mL IVPB (0 mg Intravenous Stopped 08/02/23 2324)  metroNIDAZOLE (FLAGYL) IVPB 500 mg (0 mg Intravenous Stopped 08/02/23 2236)  acetaminophen (TYLENOL) tablet 650 mg (650 mg Oral Given 08/02/23 2108)   IMPRESSION / MDM / ASSESSMENT AND PLAN / ED COURSE  I reviewed the triage vital signs and the nursing notes.                             The patient is on the cardiac monitor to evaluate for evidence of arrhythmia and/or significant heart rate changes. Patient's presentation is most consistent with acute presentation with potential threat to life or bodily function. The Pt presents with seizure and shortness of breath with a cough highly concerning for sepsis (suspected pulmonary source). At this time, the Pt is satting well on 3 L, normotensive, and appears HDS.  Will start  empiric antibiotics and fluids.  Due to normotension, will administer fluids gradually with frequent reassessment. Have low suspicion for a GI, skin/soft tissue, or CNS source at this time, but will reconsider if initial workup is unremarkable.  - CBC, BMP, LFTs - VBG - UA - BCx x2, Lactate - EKG - CXR - CT chest showing likely aspiration pneumonia - Empiric Abx: Rocephin, azithromycin, Flagyl - Fluids: 1 L LR Given patient persistent need for supplemental oxygenation, he will require admission that intermedicine service for further evaluation and management   FINAL CLINICAL IMPRESSION(S) / ED DIAGNOSES   Final diagnoses:  Sepsis with acute hypoxic respiratory failure without septic shock, due to unspecified organism [facility])   Rx / DC Orders   ED Discharge Orders     None  Note:  This document was prepared using Dragon voice recognition software and may include unintentional dictation errors.   Merwyn Katos, MD 08/02/23 [phone]

## 2023-08-02 NOTE — Assessment & Plan Note (Signed)
Concern of some aspiration while vomiting and having seizure. Patient also tested positive for influenza A, preliminary blood culture negative. -Switching to Unasyn -Continue with Zithromax -Continue with supportive care

## 2023-08-02 NOTE — H&P (Signed)
[facility]   PATIENT NAME: Darius King    MR#:  [id number]  DATE OF BIRTH:  August 27, 1960  DATE OF ADMISSION:  08/02/2023  PRIMARY CARE PHYSICIAN: Leanna Sato, MD   Patient is coming from: Home  REQUESTING/REFERRING PHYSICIAN: Donna Bernard, MD  CHIEF COMPLAINT:   Chief Complaint  Patient presents with   Seizures   Cough   Nasal Congestion    HISTORY OF PRESENT ILLNESS:  Darius King is a 63 y.o. 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  Per his caregiver he has been taking his medications including antiseizure meds regularly.  His family stated that they heard a banging on the wall when they came to see him having seizure-like episode that looked like previous episodes.  No recurrence since this episode.  He ate half a banana and an orange.  His sister was with him in the ER.  He later on had vomiting before he started having cough and chest congestion.  No other history could be obtained from the patient being nonverbal.  His sister did not report any further symptoms.  No reported dysuria, oliguria or hematuria or flank pain.  No reported chest pain or palpitations.  He had a positive sick exposure to his other sister.  The patient is nonverbal at baseline.  ED Course: When the patient came to the ER, temperature was 102 with pulse oximetry of 93% on 3 L of O2 by nasal cannula.  Labs revealed unremarkable CBC and be.  Lactic acid was 1.8.  Dilantin level was 21.7 EKG as reviewed by me :. Imaging: Portable chest x-ray showed the following: 1. A 1.1 cm in long axis nodular density projects over the left mid lung. Pulmonary neoplasm not excluded, chest CT recommended for definitive characterization. 2. Mid and lower thoracic  spondylosis.  Chest CT with contrast revealed the following: 1. Minimal patchy ground-glass in the lower lobes with endobronchial debris, findings which can be seen with aspiration. Bronchopneumonia not excluded. 2.  Aortic atherosclerosis.  The patient was given IV Rocephin, Zithromax and Flagyl, 650 mg p.o. Tylenol, 1 L bolus of IV lactated ringer for 150 mL/h.  He will be admitted to a progressive unit bed for further evaluation and management. PAST MEDICAL HISTORY:   Past Medical History:  Diagnosis Date   Blindness    Cerebral palsy ([name])    Epilepsy ([name])    Mentally challenged    Seizures ([name])     PAST SURGICAL HISTORY:   Past Surgical History:  Procedure Laterality Date   COLONOSCOPY     COLONOSCOPY WITH PROPOFOL N/A 03/06/2019   Procedure: COLONOSCOPY WITH PROPOFOL;  Surgeon: Toledo, Boykin Nearing, MD;  Location: [facility] ENDOSCOPY;  Service: Gastroenterology;  Laterality: N/A;   DENTAL SURGERY     NO PAST SURGERIES      SOCIAL HISTORY:   Social History   Tobacco Use   Smoking status: Never   Smokeless tobacco: Never  Substance Use Topics   Alcohol use: Never    FAMILY HISTORY:  History reviewed. No pertinent family history.  DRUG ALLERGIES:  No Known Allergies  REVIEW OF SYSTEMS:   ROS As per history of present illness. All pertinent systems were reviewed above. Constitutional, HEENT, cardiovascular, respiratory, GI, GU, musculoskeletal, neuro,  psychiatric, endocrine, integumentary and hematologic systems were reviewed and are otherwise negative/unremarkable except for positive findings mentioned above in the HPI.   MEDICATIONS AT HOME:   Prior to Admission medications   Medication Sig Start Date End Date Taking? Authorizing Provider  folic acid (FOLVITE) 1 MG tablet Take 1 mg by mouth daily.    [provider]  hydrocortisone 2.5 % cream Apply topically 2 (two) times daily.    [provider]  hydrocortisone 2.5 % cream Apply topically  3 (three) times daily. Apply after bm    [provider]  lamoTRIgine (LAMICTAL) 100 MG tablet Take 200 mg by mouth daily. and 250 mg at night    [provider]  LORazepam (ATIVAN) 1 MG tablet Take 1 mg by mouth once as needed for anxiety (seizure).    [provider]  lovastatin (MEVACOR) 20 MG tablet Take 20 mg by mouth daily.    [provider]  Melatonin 3 MG TABS Take 1 tablet by mouth at bedtime.    [provider]  phenytoin (DILANTIN) 100 MG ER capsule Take by mouth 2 (two) times daily.    [provider]      VITAL SIGNS:  Blood pressure 118/71, pulse 98, temperature (!) 102 F (38.9 C), temperature source Axillary, resp. rate (!) 32, SpO2 100%.  PHYSICAL EXAMINATION:  Physical Exam  GENERAL:  63 y.o.-year-old male patient lying in the bed with no acute distress.  EYES: Pupils equal, round, reactive to light and accommodation. No scleral icterus. Extraocular muscles intact.  HEENT: Head atraumatic, normocephalic. Oropharynx and nasopharynx clear.  NECK:  Supple, no jugular venous distention. No thyroid enlargement, no tenderness.  LUNGS: Diminished bibasal breath sounds with bibasal crackles. No use of accessory muscles of respiration.  CARDIOVASCULAR: Regular rate and rhythm, S1, S2 normal. No murmurs, rubs, or gallops.  ABDOMEN: Soft, nondistended, nontender. Bowel sounds present. No organomegaly or mass.  EXTREMITIES: No pedal edema, cyanosis, or clubbing.  NEUROLOGIC: Cranial nerves II through XII are intact. Muscle strength 5/5 in all extremities. Sensation intact. Gait not checked.  PSYCHIATRIC: The patient is alert and oriented x 3.  Normal affect and good eye contact. SKIN: No obvious rash, lesion, or ulcer.   LABORATORY PANEL:   CBC Recent Labs  Lab 08/02/23 1705  WBC 6.2  HGB 14.4  HCT 42.4  PLT 175    ------------------------------------------------------------------------------------------------------------------  Chemistries  Recent Labs  Lab 08/02/23 1705  NA 141  K 3.9  CL 106  CO2 24  GLUCOSE 147*  BUN 23  CREATININE 0.88  CALCIUM 8.8*   ------------------------------------------------------------------------------------------------------------------  Cardiac Enzymes No results for input(s): "TROPONINI" in the last 168 hours. ------------------------------------------------------------------------------------------------------------------  RADIOLOGY:  CT Chest W Contrast Result Date: 08/02/2023 CLINICAL DATA:  Seizure, cough, congestion and fever. EXAM: CT CHEST WITH CONTRAST TECHNIQUE: Multidetector CT imaging of the chest was performed during intravenous contrast administration. RADIATION DOSE REDUCTION: This exam was performed according to the departmental dose-optimization program which includes automated exposure control, adjustment of the mA and/or kV according to patient size and/or use of iterative reconstruction technique. CONTRAST:  75mL OMNIPAQUE IOHEXOL 300 MG/ML  SOLN COMPARISON:  None Available. FINDINGS: Cardiovascular: Atherosclerotic calcification of the aorta. Heart size normal. No pericardial effusion. Mediastinum/Nodes: No pathologically enlarged mediastinal, hilar or axillary lymph nodes. Air in the esophagus can be seen with dysmotility. Lungs/Pleura: Image quality is degraded by respiratory motion. Minimal patchy ground-glass and endobronchial debris in the lower lobes. No pleural fluid. Airway is  unremarkable. Upper Abdomen: Visualized portions of the liver, gallbladder, adrenal glands, kidneys, spleen, pancreas, stomach and bowel are grossly unremarkable. No upper abdominal adenopathy. Musculoskeletal: Degenerative changes in the spine. IMPRESSION: 1. Minimal patchy ground-glass in the lower lobes with endobronchial debris, findings which can be seen with  aspiration. Bronchopneumonia not excluded. 2.  Aortic atherosclerosis (ICD10-I70.0). Electronically Signed   By: Leanna Battles M.D.   On: 08/02/2023 20:02   DG Chest Port 1 View Result Date: 08/02/2023 CLINICAL DATA:  Prior seizure.  Cough and congestion.  Fever. EXAM: PORTABLE CHEST 1 VIEW COMPARISON:  12/27/2010 FINDINGS: The patient is rotated to the right on today's radiograph, reducing diagnostic sensitivity and specificity. A 1.1 cm in long axis nodular density projects over the left mid lung. Pulmonary neoplasm not excluded, chest CT recommended for definitive characterization. Lungs appear otherwise clear. No blunting of the costophrenic angles. Cardiac and mediastinal margins appear normal. Mid and lower thoracic spondylosis. No appreciable glenohumeral malalignment. IMPRESSION: 1. A 1.1 cm in long axis nodular density projects over the left mid lung. Pulmonary neoplasm not excluded, chest CT recommended for definitive characterization. 2. Mid and lower thoracic spondylosis. Electronically Signed   By: Gaylyn Rong M.D.   On: 08/02/2023 18:59      IMPRESSION AND PLAN:  Assessment and Plan: * Aspiration pneumonia ([name]) - The patient will be admitted to a PCU bed. - Will continue antibiotic therapy with IV Rocephin and Zithromax as well as Flagyl. - Mucolytic therapy be provided as well as duo nebs q.i.d. and q.4 hours p.r.n. - We will follow blood cultures.   Acute respiratory failure with hypoxia ([name]) - This is clearly secondary to #1. - Management as above. - O2 protocol will be followed. - We will check dissection as needed.  Seizure disorder ([name]) - We will utilize as needed IV Ativan for breakthrough seizures. - We will continue his antiseizure medications. - Dilantin level is minimally supratherapeutic.  Influenza A - We will place him on p.o. Tamiflu.  Dyslipidemia - We will continue statin therapy.   DVT prophylaxis: Lovenox.  Advanced Care Planning:  Code  Status: full code.  Family Communication:  The plan of care was discussed in details with the patient (and family). I answered all questions. The patient agreed to proceed with the above mentioned plan. Further management will depend upon hospital course. Disposition Plan: Back to previous home environment Consults called: none.  All the records are reviewed and case discussed with ED provider.  Status is: Inpatient  At the time of the admission, it appears that the appropriate admission status for this patient is inpatient.  This is judged to be reasonable and necessary in order to provide the required intensity of service to ensure the patient's safety given the presenting symptoms, physical exam findings and initial radiographic and laboratory data in the context of comorbid conditions.  The patient requires inpatient status due to high intensity of service, high risk of further deterioration and high frequency of surveillance required.  I certify that at the time of admission, it is my clinical judgment that the patient will require inpatient hospital care extending more than 2 midnights.                            Dispo: The patient is from: Home              Anticipated d/c is to: Home  Patient currently is not medically stable to d/c.              Difficult to place patient: No  Hannah Beat M.D on 08/02/2023 at 10:33 PM  [facility]   From 7 PM-7 AM, contact night-coverage [email or web address]  CC: Primary care physician; Leanna Sato, MD

## 2023-08-02 NOTE — Sepsis Progress Note (Signed)
 Following for sepsis monitoring ?

## 2023-08-02 NOTE — ED Triage Notes (Signed)
Per Pt's sister, Pt had a seizure this morning lasting around 2 minutes and cough, congestion, and fever starting today.  Sister reports Pt has not had a seizure "in a while."    Pt is non-verbal at baseline.

## 2023-08-02 NOTE — Assessment & Plan Note (Signed)
 Continue Tamiflu

## 2023-08-02 NOTE — Progress Notes (Signed)
CODE SEPSIS - PHARMACY COMMUNICATION  **Broad Spectrum Antibiotics should be administered within 1 hour of Sepsis diagnosis**  Time Code Sepsis Called/Page Received: 2058  Antibiotics Ordered: Azithro, ceftriaxone, metronidazole  Time of 1st antibiotic administration: 2123  Additional action taken by pharmacy: N/A  If necessary, Name of Provider/Nurse Contacted: N/A    Merryl Hacker ,PharmD Clinical Pharmacist  08/02/2023  9:04 PM

## 2023-08-02 NOTE — Assessment & Plan Note (Addendum)
-   We will utilize as needed IV Ativan for breakthrough seizures. - We will continue his antiseizure medications. - Dilantin level is minimally supratherapeutic.

## 2023-08-02 NOTE — Assessment & Plan Note (Signed)
 -  We will continue statin therapy.

## 2023-08-03 DIAGNOSIS — J9601 Acute respiratory failure with hypoxia: Secondary | ICD-10-CM | POA: Diagnosis not present

## 2023-08-03 DIAGNOSIS — J690 Pneumonitis due to inhalation of food and vomit: Secondary | ICD-10-CM | POA: Diagnosis not present

## 2023-08-03 DIAGNOSIS — G40909 Epilepsy, unspecified, not intractable, without status epilepticus: Secondary | ICD-10-CM | POA: Diagnosis not present

## 2023-08-03 DIAGNOSIS — J101 Influenza due to other identified influenza virus with other respiratory manifestations: Secondary | ICD-10-CM | POA: Diagnosis not present

## 2023-08-03 LAB — CBC
HCT: 39.5 % (ref 39.0–52.0)
Hemoglobin: 13.1 g/dL (ref 13.0–17.0)
MCH: 32.5 pg (ref 26.0–34.0)
MCHC: 33.2 g/dL (ref 30.0–36.0)
MCV: 98 fL (ref 80.0–100.0)
Platelets: 149 10*3/uL — ABNORMAL LOW (ref 150–400)
RBC: 4.03 MIL/uL — ABNORMAL LOW (ref 4.22–5.81)
RDW: 12.9 % (ref 11.5–15.5)
WBC: 8.1 10*3/uL (ref 4.0–10.5)
nRBC: 0 % (ref 0.0–0.2)

## 2023-08-03 LAB — BASIC METABOLIC PANEL
Anion gap: 10 (ref 5–15)
BUN: 23 mg/dL (ref 8–23)
CO2: 27 mmol/L (ref 22–32)
Calcium: 8 mg/dL — ABNORMAL LOW (ref 8.9–10.3)
Chloride: 103 mmol/L (ref 98–111)
Creatinine, Ser: 0.95 mg/dL (ref 0.61–1.24)
GFR, Estimated: >60 mL/min (ref >60)
Glucose, Bld: 130 mg/dL — ABNORMAL HIGH (ref 70–99)
Potassium: 4.1 mmol/L (ref 3.5–5.1)
Sodium: 140 mmol/L (ref 135–145)

## 2023-08-03 LAB — PROTIME-INR
INR: 1.3 — ABNORMAL HIGH (ref 0.8–1.2)
Prothrombin Time: 15.9 s — ABNORMAL HIGH (ref 11.4–15.2)

## 2023-08-03 LAB — HIV ANTIBODY (ROUTINE TESTING W REFLEX): HIV Screen 4th Generation wRfx: NONREACTIVE

## 2023-08-03 MED ORDER — SODIUM CHLORIDE 0.9 % IV SOLN
3.0000 g | Freq: Four times a day (QID) | INTRAVENOUS | Status: DC
  Administered 2023-08-03 – 2023-08-04 (×5): 3 g via INTRAVENOUS
  Filled 2023-08-03 (×5): qty 8

## 2023-08-03 MED ORDER — IPRATROPIUM-ALBUTEROL 0.5-2.5 (3) MG/3ML IN SOLN: 3.0000 mL | RESPIRATORY_TRACT | Status: DC | PRN

## 2023-08-03 MED ORDER — LAMOTRIGINE 100 MG PO TABS
300.0000 mg | ORAL_TABLET | Freq: Every day | ORAL | Status: DC
Start: 2023-08-03 — End: 2023-08-06
  Administered 2023-08-03 – 2023-08-05 (×3): 300 mg via ORAL
  Filled 2023-08-03 (×3): qty 3

## 2023-08-03 MED ORDER — LAMOTRIGINE 100 MG PO TABS
200.0000 mg | ORAL_TABLET | Freq: Every day | ORAL | Status: DC
  Administered 2023-08-03 – 2023-08-06 (×4): 200 mg via ORAL
  Filled 2023-08-03 (×4): qty 2

## 2023-08-03 NOTE — Hospital Course (Addendum)
 Taken from H&P.  Darius King is a 63 y.o. 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  Per caregiver patient was compliant with his medications.  There was a concern of vomiting before starting cough and chest congestion.  On presentation he was febrile at 102, 93% on 3 L of oxygen.  Unremarkable CBC, lactic acid 1.8, Dilantin level 21.7, respiratory panel positive for influenza A Chest x-ray with concern of 1.1 cm long axis nodular density projects over the left midlung with recommendation of CT chest which was done and shows 1. Minimal patchy ground-glass in the lower lobes with endobronchial debris, findings which can be seen with aspiration. Bronchopneumonia not excluded. 2.  Aortic atherosclerosis.  2/20: Vital stable and afebrile this morning, preliminary blood cultures negative.  Checking RVP for the type of influenza A.  Switched ceftriaxone and Flagyl with Unasyn and will continue with Zithromax.  2/21: Hemodynamically stable, desaturates on room air requiring 2 to 3 L of oxygen.  Blood cultures started turning positive 1 out of 4 with gram-negative rod-no identification yet .  RVP with influenza A H1.  Antibiotics switched with Zosyn, keeping for another day for final blood culture results.  2/22: Patient remained hemodynamically stable, becoming more agitated to go home.  Final blood culture or identification of organism remained pending.  2/23: Patient remained hemodynamically stable but worsening agitation and sister really requesting discharge.  Pharmacy tried calling microbiology lab few times but still no identification of that 1 out of 4 bottle of blood cultures which turned positive on day 3.  Patient received 3 days of Zosyn and is being  discharged on 4 more days of Levaquin.  He appears to be at baseline now and no other active sign of infection.  Patient will continue with his medications and need to have a close follow-up with his provider for further management.

## 2023-08-03 NOTE — ED Notes (Signed)
Pt brief changed, sister at bedside and assisting this RN to change pt

## 2023-08-03 NOTE — ED Notes (Signed)
Pt brief changed, new brief placed, and new pants placed. Family at bedside

## 2023-08-03 NOTE — ED Notes (Signed)
CCMD called to transfer pt into this room

## 2023-08-03 NOTE — ED Notes (Signed)
Patient has cognizant deficit at baseline and will not leave his monitoring cords on

## 2023-08-03 NOTE — Progress Notes (Signed)
  Progress Note   Patient: Darius King KZS:[id number] DOB: 07-May-1961 DOA: 08/02/2023     1 DOS: the patient was seen and examined on 08/03/2023   Brief hospital course: Taken from H&P.  Darius King is a 63 y.o. 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  Per caregiver patient was compliant with his medications.  There was a concern of vomiting before starting cough and chest congestion.  On presentation he was febrile at 102, 93% on 3 L of oxygen.  Unremarkable CBC, lactic acid 1.8, Dilantin level 21.7, respiratory panel positive for influenza A Chest x-ray with concern of 1.1 cm long axis nodular density projects over the left midlung with recommendation of CT chest which was done and shows 1. Minimal patchy ground-glass in the lower lobes with endobronchial debris, findings which can be seen with aspiration. Bronchopneumonia not excluded. 2.  Aortic atherosclerosis.  2/20: Vital stable and afebrile this morning, preliminary blood cultures negative.  Checking RVP for the type of influenza A.  Switched ceftriaxone and Flagyl with Unasyn and will continue with Zithromax.  Assessment and Plan: * Aspiration pneumonia ([name]) Concern of some aspiration while vomiting and having seizure. Patient also tested positive for influenza A, preliminary blood culture negative. -Switching to Unasyn -Continue with Zithromax -Continue with supportive care  Influenza A - Continue Tamiflu.  Acute respiratory failure with hypoxia ([name]) Patient requiring up to 2 L of oxygen with no baseline oxygen use. Likely with influenza and aspiration pneumonia. -Continue supplemental oxygen-wean as tolerated  Seizure disorder ([name]) - We will utilize as needed IV Ativan for  breakthrough seizures. - We will continue his antiseizure medications. - Dilantin level is minimally supratherapeutic.  Dyslipidemia - We will continue statin therapy.   Subjective: Patient was resting comfortably when I entered her room, on waking up he was becoming little agitated but his caregiver was able to settle him down.  Per sister who is his caregiver, he is at his baseline now.  Physical Exam: Vitals:   08/03/23 0352 08/03/23 0427 08/03/23 0500 08/03/23 0700  BP:  (!) 91/54 (!) 98/58 99/61  Pulse: 74 76 76 76  Resp:  20 20   Temp:  98.9 F (37.2 C)    TempSrc:  Axillary    SpO2: 96% 98% 95% 100%   General.  Cognitively impaired gentleman, in no acute distress. Pulmonary.  Lungs clear bilaterally, normal respiratory effort. CV.  Regular rate and rhythm, no JVD, rub or murmur. Abdomen.  Soft, nontender, nondistended, BS positive. CNS.  Awake and little agitated, no apparent neuro deficit Extremities.  No edema,  pulses intact and symmetrical.   Data Reviewed: Prior data reviewed  Family Communication: Discussed with sister who is also his caregiver at bedside  Disposition: Status is: Inpatient Remains inpatient appropriate because: Severity of illness  Planned Discharge Destination: Home  DVT prophylaxis.  Lovenox Time spent: 50 minutes  This record has been created using Conservation officer, historic buildings. Errors have been sought and corrected,but may not always be located. Such creation errors do not reflect on the standard of care.   Author: Arnetha Courser, MD 08/03/2023 2:02 PM  For on call review [email or web address].

## 2023-08-04 DIAGNOSIS — J101 Influenza due to other identified influenza virus with other respiratory manifestations: Secondary | ICD-10-CM | POA: Diagnosis not present

## 2023-08-04 DIAGNOSIS — G40909 Epilepsy, unspecified, not intractable, without status epilepticus: Secondary | ICD-10-CM | POA: Diagnosis not present

## 2023-08-04 DIAGNOSIS — J9601 Acute respiratory failure with hypoxia: Secondary | ICD-10-CM | POA: Diagnosis not present

## 2023-08-04 DIAGNOSIS — J690 Pneumonitis due to inhalation of food and vomit: Secondary | ICD-10-CM | POA: Diagnosis not present

## 2023-08-04 LAB — BLOOD CULTURE ID PANEL (REFLEXED) - BCID2

## 2023-08-04 LAB — RESPIRATORY PANEL BY PCR

## 2023-08-04 LAB — CORTISOL-AM, BLOOD: Cortisol - AM: 24.5 ug/dL — ABNORMAL HIGH (ref 6.7–22.6)

## 2023-08-04 LAB — LAMOTRIGINE LEVEL: Lamotrigine Lvl: 8.3 ug/mL (ref 2.0–20.0)

## 2023-08-04 MED ORDER — LORAZEPAM 2 MG/ML IJ SOLN
1.0000 mg | INTRAMUSCULAR | Status: DC | PRN
  Administered 2023-08-04: 1 mg via INTRAVENOUS

## 2023-08-04 MED ORDER — PIPERACILLIN-TAZOBACTAM 3.375 G IVPB
3.3750 g | Freq: Three times a day (TID) | INTRAVENOUS | Status: DC
  Administered 2023-08-04 – 2023-08-06 (×5): 3.375 g via INTRAVENOUS
  Filled 2023-08-04 (×5): qty 50

## 2023-08-04 MED ORDER — AMOXICILLIN-POT CLAVULANATE 875-125 MG PO TABS: 1.0000 | ORAL_TABLET | Freq: Two times a day (BID) | ORAL | Status: DC

## 2023-08-04 MED ORDER — AZITHROMYCIN 250 MG PO TABS
500.0000 mg | ORAL_TABLET | Freq: Every day | ORAL | Status: AC
  Administered 2023-08-04: 500 mg via ORAL
  Filled 2023-08-04: qty 2

## 2023-08-04 NOTE — Plan of Care (Signed)

## 2023-08-04 NOTE — Plan of Care (Signed)
  Problem: Respiratory: Goal: Ability to maintain adequate ventilation will improve Outcome: Progressing   Problem: Activity: Goal: Risk for activity intolerance will decrease Outcome: Progressing   Problem: Nutrition: Goal: Adequate nutrition will be maintained Outcome: Progressing   Problem: Safety: Goal: Ability to remain free from injury will improve Outcome: Progressing   

## 2023-08-04 NOTE — ED Notes (Signed)
Family called out to inform this RN that IV was removed by pt while unasyn was being administered. This RN paused infusion and restarted a new IV. Called pharmacy to inform that unasyn may have mildly infiltrated prior to being removed.

## 2023-08-04 NOTE — ED Notes (Signed)
 Family at bedside.

## 2023-08-04 NOTE — Assessment & Plan Note (Signed)
Concern of some aspiration while vomiting and having seizure. Patient also tested positive for influenza A, preliminary blood culture started turning positive, 1 out of 4 with GNR, no identification yet -Switching to Zosyn -Continue with Zithromax -Continue with supportive care -Follow-up final culture results.

## 2023-08-04 NOTE — ED Notes (Addendum)
Pt remains on blow by oxygen at 6L placed by nightshift RN. RN concerned due to pt's oxygen sats fluctuating  between 86-91%. Pt is more active in the bed causing trach collar to not efficiently provide blow by. MD messaged via secure chat to see if RT needs to be requested at bedside. Pt unable to tolerate [place] on face

## 2023-08-04 NOTE — Progress Notes (Signed)
PHARMACY - PHYSICIAN COMMUNICATION CRITICAL VALUE ALERT - BLOOD CULTURE IDENTIFICATION (BCID)  Darius King is an 63 y.o. male who presented to [facility] on 08/02/2023 with a chief complaint of flu-like symptoms and seizure  Assessment:  Blood culture from 2/19 with GNR, nothing detected on BCID panel.  On antibiotics for pneumonia  Name of physician (or Provider) Contacted: Dr Nelson Chimes  Current antibiotics: ampicillin/sulbactam  Changes to prescribed antibiotics recommended:  Recommendations accepted by provider - change to zosyn,  avoided cefepime for concern of seizure at admit  Results for orders placed or performed during the hospital encounter of 08/02/23  Blood Culture ID Panel (Reflexed) (Collected: 08/02/2023  9:12 PM)  Result Value Ref Range   Enterococcus faecalis NOT DETECTED NOT DETECTED   Enterococcus Faecium NOT DETECTED NOT DETECTED   Listeria monocytogenes NOT DETECTED NOT DETECTED   Staphylococcus species NOT DETECTED NOT DETECTED   Staphylococcus aureus (BCID) NOT DETECTED NOT DETECTED   Staphylococcus epidermidis NOT DETECTED NOT DETECTED   Staphylococcus lugdunensis NOT DETECTED NOT DETECTED   Streptococcus species NOT DETECTED NOT DETECTED   Streptococcus agalactiae NOT DETECTED NOT DETECTED   Streptococcus pneumoniae NOT DETECTED NOT DETECTED   Streptococcus pyogenes NOT DETECTED NOT DETECTED   A.calcoaceticus-baumannii NOT DETECTED NOT DETECTED   Bacteroides fragilis NOT DETECTED NOT DETECTED   Enterobacterales NOT DETECTED NOT DETECTED   Enterobacter cloacae complex NOT DETECTED NOT DETECTED   Escherichia coli NOT DETECTED NOT DETECTED   Klebsiella aerogenes NOT DETECTED NOT DETECTED   Klebsiella oxytoca NOT DETECTED NOT DETECTED   Klebsiella pneumoniae NOT DETECTED NOT DETECTED   Proteus species NOT DETECTED NOT DETECTED   Salmonella species NOT DETECTED NOT DETECTED   Serratia marcescens NOT DETECTED NOT DETECTED   Haemophilus influenzae NOT DETECTED  NOT DETECTED   Neisseria meningitidis NOT DETECTED NOT DETECTED   Pseudomonas aeruginosa NOT DETECTED NOT DETECTED   Stenotrophomonas maltophilia NOT DETECTED NOT DETECTED   Candida albicans NOT DETECTED NOT DETECTED   Candida auris NOT DETECTED NOT DETECTED   Candida glabrata NOT DETECTED NOT DETECTED   Candida krusei NOT DETECTED NOT DETECTED   Candida parapsilosis NOT DETECTED NOT DETECTED   Candida tropicalis NOT DETECTED NOT DETECTED   Cryptococcus neoformans/gattii NOT DETECTED NOT DETECTED    Juliette Alcide, PharmD, BCPS, BCIDP Work Cell: [phone] 08/04/2023 1:36 PM

## 2023-08-04 NOTE — ED Notes (Signed)
Warm compress applied to upper arm per pharmacy recommendation. Very minimal swelling noted to previous IV site.

## 2023-08-04 NOTE — ED Notes (Signed)
This RN assisted pt's sister in changing pt's brief. Pt hit himself in the head the entire brief change. Sister states that patient has been doing this his entire life.

## 2023-08-04 NOTE — Progress Notes (Signed)
Progress Note   Patient: Darius King:[id number] DOB: 1961/02/14 DOA: 08/02/2023     2 DOS: the patient was seen and examined on 08/04/2023   Brief hospital course: Taken from H&P.  SHANTE MAYSONET is a 63 y.o. 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  Per caregiver patient was compliant with his medications.  There was a concern of vomiting before starting cough and chest congestion.  On presentation he was febrile at 102, 93% on 3 L of oxygen.  Unremarkable CBC, lactic acid 1.8, Dilantin level 21.7, respiratory panel positive for influenza A Chest x-ray with concern of 1.1 cm long axis nodular density projects over the left midlung with recommendation of CT chest which was done and shows 1. Minimal patchy ground-glass in the lower lobes with endobronchial debris, findings which can be seen with aspiration. Bronchopneumonia not excluded. 2.  Aortic atherosclerosis.  2/20: Vital stable and afebrile this morning, preliminary blood cultures negative.  Checking RVP for the type of influenza A.  Switched ceftriaxone and Flagyl with Unasyn and will continue with Zithromax.  2/21: Hemodynamically stable, desaturates on room air requiring 2 to 3 L of oxygen.  Blood cultures started turning positive 1 out of 4 with gram-negative rod-no identification yet .  RVP with influenza A H1.  Antibiotics switched with Zosyn, keeping for another day for final blood culture results.  Assessment and Plan: * Aspiration pneumonia ([name]) Concern of some aspiration while vomiting and having seizure. Patient also tested positive for influenza A, preliminary blood culture started turning positive, 1 out of 4 with GNR, no identification yet -Switching to Zosyn -Continue with  Zithromax -Continue with supportive care -Follow-up final culture results.  Influenza A - Continue Tamiflu.  Acute respiratory failure with hypoxia ([name]) Patient requiring up to 2-3 L of oxygen with no baseline oxygen use. Likely with influenza and aspiration pneumonia. -Continue supplemental oxygen-wean as tolerated  Seizure disorder ([name]) - We will utilize as needed IV Ativan for breakthrough seizures. - We will continue his antiseizure medications. - Dilantin level is minimally supratherapeutic.  Dyslipidemia - We will continue statin therapy.   Subjective: Patient was seen and examined today.  Sister and another family member at bedside.  She wants him to go back home as he is becoming little agitated in the hospital.  Physical Exam: Vitals:   08/04/23 1030 08/04/23 1135 08/04/23 1145 08/04/23 1315  BP: (!) 101/59     Pulse: 71 76 71 70  Resp: 16 19 16 19   Temp:      TempSrc:      SpO2: 92% 93% 96% 92%   General.  Cognitively impaired gentleman, in no acute distress.  Trach collar in place. Pulmonary.  Lungs clear bilaterally, normal respiratory effort. CV.  Regular rate and rhythm, no JVD, rub or murmur. Abdomen.  Soft, nontender, nondistended, BS positive. CNS.  Alert and oriented .  No focal neurologic deficit. Extremities.  No edema, no cyanosis, pulses intact and symmetrical.   Data Reviewed: Prior data reviewed  Family Communication: Discussed with sister who is also his caregiver at bedside  Disposition: Status is: Inpatient Remains inpatient appropriate because: Severity of illness  Planned Discharge Destination: Home  DVT prophylaxis.  Lovenox Time spent: 50 minutes  This record has been created using  Dragon Chemical engineer. Errors have been sought and corrected,but may not always be located. Such creation errors do not reflect on the standard of care.   Author: Arnetha Courser, MD 08/04/2023 2:10 PM  For on call review [email or web address].

## 2023-08-04 NOTE — Assessment & Plan Note (Addendum)
 Patient requiring up to 2-3 L of oxygen with no baseline oxygen use.  Able to wean back to room air now Likely with influenza and aspiration pneumonia. -Continue supplemental oxygen-wean as tolerated

## 2023-08-04 NOTE — TOC Initial Note (Signed)
Transition of Care [facility]) - Initial/Assessment Note    Patient Details  Name: NALIN MAZZOCCO MRN: [id number] Date of Birth: 1960-07-15  [facility] CM/SW Contact:    Erin Sons, LCSW Phone Number: 08/04/2023, 2:00 PM  Clinical Narrative:                  CSW notified by attending that pt's sister wanting to take pt home and that pt will be needing home O2. CSW met with pt's sister and confirmed address listed in chart. She is agreeable to CSW arranging O2.   CSW ordered O2 through [employer].   Expected Discharge Plan: Home/Self Care Barriers to Discharge: Continued Medical Work up             Expected Discharge Plan and Services       Living arrangements for the past 2 months: Single Family Home                 DME Arranged: Oxygen DME Agency: [employer] Date DME Agency Contacted: 08/04/23 Time DME Agency Contacted: 1400              Prior Living Arrangements/Services Living arrangements for the past 2 months: Single Family Home Lives with:: Siblings Patient language and need for interpreter reviewed:: Yes        Need for Family Participation in Patient Care: Yes (Comment) Care giver support system in place?: Yes (comment)   Criminal Activity/Legal Involvement Pertinent to Current Situation/Hospitalization: No - Comment as needed  Activities of Daily Living      Permission Sought/Granted                  Emotional Assessment Appearance:: Appears stated age       Alcohol / Substance Use: Not Applicable Psych Involvement: No (comment)  Admission diagnosis:  Aspiration pneumonia ([name]) [J69.0] Patient Active Problem List   Diagnosis Date Noted   Aspiration pneumonia ([name]) 08/02/2023   Acute respiratory failure with hypoxia ([name]) 08/02/2023   Seizure disorder ([name]) 08/02/2023   Dyslipidemia 08/02/2023   Influenza A 08/02/2023   PCP:  Leanna Sato, MD Pharmacy:   [facility] [id number], [place] - [address] [place] [address] Phone: [phone] Fax: [phone]     Social Drivers of Health (SDOH) Social History: SDOH Screenings   Tobacco Use: Low Risk  (08/02/2023)   SDOH Interventions:     Readmission Risk Interventions     No data to display

## 2023-08-04 NOTE — ED Notes (Signed)
Informed RN bed assigned 

## 2023-08-04 NOTE — ED Notes (Signed)
Pt's brief changed after patient had a BM. Pt also had a complete linen change.

## 2023-08-04 NOTE — ED Notes (Signed)
Pt's O2 saturation 87% while patient was moving in bed and temporarily off oxygen. Placed oxygen back in place and pt's O2 saturation increased to 96%.

## 2023-08-04 NOTE — ED Notes (Signed)
Pt noted to have BM. Pt cleaned by this RN and family member. New brief in place. Pt repositioned in bed for medication administration

## 2023-08-04 NOTE — ED Notes (Signed)
RN at bedside. Pt was placed on blow by oxygen via trach collar by night shift RN. Pt tolerating well at this time. Family at bedside

## 2023-08-05 DIAGNOSIS — G40909 Epilepsy, unspecified, not intractable, without status epilepticus: Secondary | ICD-10-CM | POA: Diagnosis not present

## 2023-08-05 DIAGNOSIS — J9601 Acute respiratory failure with hypoxia: Secondary | ICD-10-CM | POA: Diagnosis not present

## 2023-08-05 DIAGNOSIS — J690 Pneumonitis due to inhalation of food and vomit: Secondary | ICD-10-CM | POA: Diagnosis not present

## 2023-08-05 DIAGNOSIS — J101 Influenza due to other identified influenza virus with other respiratory manifestations: Secondary | ICD-10-CM | POA: Diagnosis not present

## 2023-08-05 NOTE — Progress Notes (Signed)
 Progress Note   Patient: Darius King FAO:[id number] DOB: 06/03/1961 DOA: 08/02/2023     3 DOS: the patient was seen and examined on 08/05/2023   Brief hospital course: Taken from H&P.  Darius King is a 63 y.o. 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  Per caregiver patient was compliant with his medications.  There was a concern of vomiting before starting cough and chest congestion.  On presentation he was febrile at 102, 93% on 3 L of oxygen.  Unremarkable CBC, lactic acid 1.8, Dilantin level 21.7, respiratory panel positive for influenza A Chest x-ray with concern of 1.1 cm long axis nodular density projects over the left midlung with recommendation of CT chest which was done and shows 1. Minimal patchy ground-glass in the lower lobes with endobronchial debris, findings which can be seen with aspiration. Bronchopneumonia not excluded. 2.  Aortic atherosclerosis.  2/20: Vital stable and afebrile this morning, preliminary blood cultures negative.  Checking RVP for the type of influenza A.  Switched ceftriaxone and Flagyl with Unasyn and will continue with Zithromax.  2/21: Hemodynamically stable, desaturates on room air requiring 2 to 3 L of oxygen.  Blood cultures started turning positive 1 out of 4 with gram-negative rod-no identification yet .  RVP with influenza A H1.  Antibiotics switched with Zosyn, keeping for another day for final blood culture results.  2/22: Patient remained hemodynamically stable, becoming more agitated to go home.  Final blood culture or identification of organism remained pending  Assessment and Plan: * Aspiration pneumonia ([name]) Concern of some aspiration while vomiting and having seizure. Patient also tested positive  for influenza A, preliminary blood culture started turning positive, 1 out of 4 with GNR, no identification yet -Switching to Zosyn -Continue with Zithromax -Continue with supportive care -Follow-up final culture results.  Influenza A - Continue Tamiflu.  Acute respiratory failure with hypoxia ([name]) Patient requiring up to 2-3 L of oxygen with no baseline oxygen use.  Able to wean back to room air now Likely with influenza and aspiration pneumonia. -Continue supplemental oxygen-wean as tolerated  Seizure disorder ([name]) - We will utilize as needed IV Ativan for breakthrough seizures. - We will continue his antiseizure medications. - Dilantin level is minimally supratherapeutic.  Dyslipidemia - We will continue statin therapy.   Subjective: Patient was seen and examined today.  Becoming little more agitated and sister wants to take him back home.  Physical Exam: Vitals:   08/04/23 2012 08/05/23 0500 08/05/23 0900 08/05/23 1212  BP: 114/74 116/76 107/82 99/83  Pulse: 76 61 68 60  Resp: 20 18    Temp: 98.7 F (37.1 C) 98.6 F (37 C)    TempSrc: Axillary Axillary    SpO2: 94% 95% 94% 92%  Weight:       General.  Cognitively impaired, little agitated gentleman, in no acute distress. Pulmonary.  Lungs clear bilaterally, normal respiratory effort. CV.  Regular rate and rhythm, no JVD, rub or murmur. Abdomen.  Soft, nontender, nondistended, BS positive. CNS.  Alert and oriented .  No focal neurologic deficit. Extremities.  No edema, no cyanosis, pulses intact and symmetrical.   Data Reviewed: Prior data reviewed  Family Communication: Discussed with sister who is also his caregiver at bedside  Disposition: Status is: Inpatient Remains  inpatient appropriate because: Severity of illness  Planned Discharge Destination: Home  DVT prophylaxis.  Lovenox Time spent: 45 minutes  This record has been created using Conservation officer, historic buildings. Errors have been sought and  corrected,but may not always be located. Such creation errors do not reflect on the standard of care.   Author: Arnetha Courser, MD 08/05/2023 2:43 PM  For on call review [email or web address].

## 2023-08-05 NOTE — Plan of Care (Signed)
  Problem: Fluid Volume: Goal: Hemodynamic stability will improve Outcome: Progressing   Problem: Clinical Measurements: Goal: Diagnostic test results will improve Outcome: Progressing   Problem: Education: Goal: Knowledge of General Education information will improve Description: Including pain rating scale, medication(s)/side effects and non-pharmacologic comfort measures Outcome: Progressing   Problem: Health Behavior/Discharge Planning: Goal: Ability to manage health-related needs will improve Outcome: Progressing   Problem: Clinical Measurements: Goal: Will remain free from infection Outcome: Progressing Goal: Diagnostic test results will improve Outcome: Progressing   Problem: Coping: Goal: Level of anxiety will decrease Outcome: Progressing

## 2023-08-06 DIAGNOSIS — J690 Pneumonitis due to inhalation of food and vomit: Secondary | ICD-10-CM | POA: Diagnosis not present

## 2023-08-06 DIAGNOSIS — G40909 Epilepsy, unspecified, not intractable, without status epilepticus: Secondary | ICD-10-CM | POA: Diagnosis not present

## 2023-08-06 DIAGNOSIS — J101 Influenza due to other identified influenza virus with other respiratory manifestations: Secondary | ICD-10-CM | POA: Diagnosis not present

## 2023-08-06 DIAGNOSIS — J9601 Acute respiratory failure with hypoxia: Secondary | ICD-10-CM | POA: Diagnosis not present

## 2023-08-06 MED ORDER — LEVOFLOXACIN 500 MG PO TABS
750.0000 mg | ORAL_TABLET | Freq: Every day | ORAL | Status: DC
  Administered 2023-08-06: 750 mg via ORAL
  Filled 2023-08-06: qty 2

## 2023-08-06 MED ORDER — OSELTAMIVIR PHOSPHATE 75 MG PO CAPS: 75.0000 mg | ORAL_CAPSULE | Freq: Two times a day (BID) | ORAL | 0 refills | Status: AC

## 2023-08-06 MED ORDER — DM-GUAIFENESIN ER 30-600 MG PO TB12: 1.0000 | ORAL_TABLET | Freq: Two times a day (BID) | ORAL | 0 refills | Status: AC | PRN

## 2023-08-06 MED ORDER — LEVOFLOXACIN 750 MG PO TABS: 750.0000 mg | ORAL_TABLET | Freq: Every day | ORAL | 0 refills | Status: AC

## 2023-08-06 NOTE — Discharge Summary (Signed)
 Physician Discharge Summary   Patient: Darius King MRN: [id number] DOB: 07/05/1960  Admit date:     08/02/2023  Discharge date: 08/06/23  Discharge Physician: Arnetha Courser   PCP: Leanna Sato, MD   Recommendations at discharge:  Please obtain CBC and BMP on follow-up Follow-up final blood culture results Follow-up with primary care provider within a week  Discharge Diagnoses: Principal Problem:   Aspiration pneumonia [name]) Active Problems:   Acute respiratory failure with hypoxia ([name])   Influenza A   Seizure disorder [name])   Dyslipidemia   Hospital Course: Taken from H&P.  Darius King is a 63 y.o. 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  Per caregiver patient was compliant with his medications.  There was a concern of vomiting before starting cough and chest congestion.  On presentation he was febrile at 102, 93% on 3 L of oxygen.  Unremarkable CBC, lactic acid 1.8, Dilantin level 21.7, respiratory panel positive for influenza A Chest x-ray with concern of 1.1 cm long axis nodular density projects over the left midlung with recommendation of CT chest which was done and shows 1. Minimal patchy ground-glass in the lower lobes with endobronchial debris, findings which can be seen with aspiration. Bronchopneumonia not excluded. 2.  Aortic atherosclerosis.  2/20: Vital stable and afebrile this morning, preliminary blood cultures negative.  Checking RVP for the type of influenza A.  Switched ceftriaxone and Flagyl with Unasyn and will continue with Zithromax.  2/21: Hemodynamically stable, desaturates on room air requiring 2 to 3 L of oxygen.  Blood cultures started turning positive 1 out of 4 with gram-negative rod-no identification yet .   RVP with influenza A H1.  Antibiotics switched with Zosyn, keeping for another day for final blood culture results.  2/22: Patient remained hemodynamically stable, becoming more agitated to go home.  Final blood culture or identification of organism remained pending.  2/23: Patient remained hemodynamically stable but worsening agitation and sister really requesting discharge.  Pharmacy tried calling microbiology lab few times but still no identification of that 1 out of 4 bottle of blood cultures which turned positive on day 3.  Patient received 3 days of Zosyn and is being discharged on 4 more days of Levaquin.  He appears to be at baseline now and no other active sign of infection.  Patient will continue with his medications and need to have a close follow-up with his provider for further management.  Assessment and Plan: * Aspiration pneumonia ([name]) Concern of some aspiration while vomiting and having seizure. Patient also tested positive for influenza A, preliminary blood culture started turning positive, 1 out of 4 with GNR, no identification yet -Zosyn is being switched with Levaquin for discharge -Continue with supportive care -Follow-up final culture results.  Influenza A - Continue Tamiflu.  Acute respiratory failure with hypoxia ([name]) Patient requiring up to 2-3 L of oxygen with no baseline oxygen use.  Able to wean back to room air now Likely with influenza and aspiration pneumonia. -Continue supplemental oxygen-wean as tolerated  Seizure disorder ([name]) - We will utilize as needed IV Ativan for breakthrough seizures. - We will continue his antiseizure medications. - Dilantin level is minimally supratherapeutic.  Dyslipidemia - We will continue statin therapy.  Consultants: None Procedures performed: None Disposition:  Home Diet recommendation:  Discharge Diet Orders (From admission, onward)     Start     Ordered   08/06/23 0000  Diet - low sodium heart healthy         08/06/23 1202           Regular diet DISCHARGE MEDICATION: Allergies as of 08/06/2023   No Known Allergies      Medication List     TAKE these medications    Calcium Carb-Cholecalciferol 600-10 MG-MCG Tabs Take 1 tablet by mouth 2 (two) times daily.   dextromethorphan-guaiFENesin 30-600 MG 12hr tablet Commonly known as: MUCINEX DM Take 1 tablet by mouth 2 (two) times daily as needed for cough.   folic acid 1 MG tablet Commonly known as: FOLVITE Take 1 mg by mouth daily.   hydrocortisone 2.5 % cream Apply topically 3 (three) times daily. Apply after bm   lamoTRIgine 100 MG tablet Commonly known as: LAMICTAL Take 200 mg by mouth every morning and 300 mg in the evening.   levofloxacin 750 MG tablet Commonly known as: LEVAQUIN Take 1 tablet (750 mg total) by mouth daily for 4 days.   LORazepam 1 MG tablet Commonly known as: ATIVAN Take 1 mg by mouth once as needed for seizure.   lovastatin 20 MG tablet Commonly known as: MEVACOR Take 20 mg by mouth daily.   melatonin 3 MG Tabs tablet Take 1 tablet by mouth at bedtime.   oseltamivir 75 MG capsule Commonly known as: TAMIFLU Take 1 capsule (75 mg total) by mouth 2 (two) times daily for 2 days.   phenytoin 100 MG ER capsule Commonly known as: DILANTIN Take 100 mg by mouth 2 (two) times daily.               Durable Medical Equipment  (From admission, onward)           Start     Ordered   08/04/23 1309  For home use only DME oxygen  Once       Question Answer Comment  Length of Need 6 Months   Mode or (Route) Mask   Liters per Minute 3   Frequency Continuous (stationary and portable oxygen unit needed)   Oxygen conserving device Yes   Oxygen delivery system Other see comments      08/04/23 1309            Follow-up Information     Leanna Sato, MD. Schedule an appointment as soon as possible for a visit in 1 week(s).   Specialty: Family Medicine Contact information: [address] [name] [phone]                Discharge Exam: Ceasar Mons Weights   08/04/23 1700  Weight: 47.6 kg   General.  Cognitively impaired gentleman, appears little agitated Pulmonary.  Lungs clear bilaterally, normal respiratory effort. CV.  Regular rate and rhythm, no JVD, rub or murmur. Abdomen.  Soft, nontender, nondistended, BS positive. CNS.  Alert and oriented .  No focal neurologic deficit. Extremities.  No edema, no cyanosis, pulses intact and symmetrical.  Condition at discharge: stable  The results of significant diagnostics from this hospitalization (including imaging, microbiology, ancillary and laboratory) are listed below for reference.   Imaging Studies: CT Chest W Contrast Result Date: 08/02/2023 CLINICAL DATA:  Seizure, cough, congestion and fever. EXAM: CT CHEST WITH CONTRAST TECHNIQUE: Multidetector CT imaging of the chest was performed during intravenous contrast administration. RADIATION DOSE REDUCTION: This  exam was performed according to the departmental dose-optimization program which includes automated exposure control, adjustment of the mA and/or kV according to patient size and/or use of iterative reconstruction technique. CONTRAST:  75mL OMNIPAQUE IOHEXOL 300 MG/ML  SOLN COMPARISON:  None Available. FINDINGS: Cardiovascular: Atherosclerotic calcification of the aorta. Heart size normal. No pericardial effusion. Mediastinum/Nodes: No pathologically enlarged mediastinal, hilar or axillary lymph nodes. Air in the esophagus can be seen with dysmotility. Lungs/Pleura: Image quality is degraded by respiratory motion. Minimal patchy ground-glass and endobronchial debris in the lower lobes. No pleural fluid. Airway is unremarkable. Upper Abdomen: Visualized portions of the liver, gallbladder, adrenal glands, kidneys, spleen, pancreas, stomach and bowel are grossly unremarkable. No upper abdominal adenopathy. Musculoskeletal: Degenerative changes in  the spine. IMPRESSION: 1. Minimal patchy ground-glass in the lower lobes with endobronchial debris, findings which can be seen with aspiration. Bronchopneumonia not excluded. 2.  Aortic atherosclerosis (ICD10-I70.0). Electronically Signed   By: Leanna Battles M.D.   On: 08/02/2023 20:02   DG Chest Port 1 View Result Date: 08/02/2023 CLINICAL DATA:  Prior seizure.  Cough and congestion.  Fever. EXAM: PORTABLE CHEST 1 VIEW COMPARISON:  12/27/2010 FINDINGS: The patient is rotated to the right on today's radiograph, reducing diagnostic sensitivity and specificity. A 1.1 cm in long axis nodular density projects over the left mid lung. Pulmonary neoplasm not excluded, chest CT recommended for definitive characterization. Lungs appear otherwise clear. No blunting of the costophrenic angles. Cardiac and mediastinal margins appear normal. Mid and lower thoracic spondylosis. No appreciable glenohumeral malalignment. IMPRESSION: 1. A 1.1 cm in long axis nodular density projects over the left mid lung. Pulmonary neoplasm not excluded, chest CT recommended for definitive characterization. 2. Mid and lower thoracic spondylosis. Electronically Signed   By: Gaylyn Rong M.D.   On: 08/02/2023 18:59    Microbiology: Results for orders placed or performed during the hospital encounter of 08/02/23  Resp panel by RT-PCR (RSV, Flu A&B, Covid) Anterior Nasal Swab     Status: Abnormal   Collection Time: 08/02/23  5:06 PM   Specimen: Anterior Nasal Swab  Result Value Ref Range Status   SARS Coronavirus 2 by RT PCR NEGATIVE NEGATIVE Final    Comment: (NOTE) SARS-CoV-2 target nucleic acids are NOT DETECTED.  The SARS-CoV-2 RNA is generally detectable in upper respiratory specimens during the acute phase of infection. The lowest concentration of SARS-CoV-2 viral copies this assay can detect is 138 copies/mL. A negative result does not preclude SARS-Cov-2 infection and should not be used as the sole basis for  treatment or other patient management decisions. A negative result may occur with  improper specimen collection/handling, submission of specimen other than nasopharyngeal swab, presence of viral mutation(s) within the areas targeted by this assay, and inadequate number of viral copies(<138 copies/mL). A negative result must be combined with clinical observations, patient history, and epidemiological information. The expected result is Negative.  Fact Sheet for Patients:  BloggerCourse.com  Fact Sheet for Healthcare Providers:  SeriousBroker.it  This test is no t yet approved or cleared by the [place] FDA and  has been authorized for detection and/or diagnosis of SARS-CoV-2 by FDA under an Emergency Use Authorization (EUA). This EUA will remain  in effect (meaning this test can be used) for the duration of the COVID-19 declaration under Section 564(b)(1) of the Act, 21 U.S.C.section 360bbb-3(b)(1), unless the authorization is terminated  or revoked sooner.       Influenza A by PCR POSITIVE (A) NEGATIVE Final  Influenza B by PCR NEGATIVE NEGATIVE Final    Comment: (NOTE) The Xpert Xpress SARS-CoV-2/FLU/RSV plus assay is intended as an aid in the diagnosis of influenza from Nasopharyngeal swab specimens and should not be used as a sole basis for treatment. Nasal washings and aspirates are unacceptable for Xpert Xpress SARS-CoV-2/FLU/RSV testing.  Fact Sheet for Patients: BloggerCourse.com  Fact Sheet for Healthcare Providers: SeriousBroker.it  This test is not yet approved or cleared by the [place] FDA and has been authorized for detection and/or diagnosis of SARS-CoV-2 by FDA under an Emergency Use Authorization (EUA). This EUA will remain in effect (meaning this test can be used) for the duration of the COVID-19 declaration under Section 564(b)(1) of the Act, 21  U.S.C. section 360bbb-3(b)(1), unless the authorization is terminated or revoked.     Resp Syncytial Virus by PCR NEGATIVE NEGATIVE Final    Comment: (NOTE) Fact Sheet for Patients: BloggerCourse.com  Fact Sheet for Healthcare Providers: SeriousBroker.it  This test is not yet approved or cleared by the [place] FDA and has been authorized for detection and/or diagnosis of SARS-CoV-2 by FDA under an Emergency Use Authorization (EUA). This EUA will remain in effect (meaning this test can be used) for the duration of the COVID-19 declaration under Section 564(b)(1) of the Act, 21 U.S.C. section 360bbb-3(b)(1), unless the authorization is terminated or revoked.  Performed at [facility], [address] [place], [place] [address]   Blood culture (routine x 2)     Status: None (Preliminary result)   Collection Time: 08/02/23  9:12 PM   Specimen: BLOOD  Result Value Ref Range Status   Specimen Description BLOOD LEFT ARM  Final   Special Requests   Final    BOTTLES DRAWN AEROBIC AND ANAEROBIC Blood Culture results may not be optimal due to an inadequate volume of blood received in culture bottles   Culture   Final    NO GROWTH 4 DAYS Performed at [facility], [address] [place], [place] [address]    Report Status PENDING  Incomplete  Blood culture (routine x 2)     Status: None (Preliminary result)   Collection Time: 08/02/23  9:12 PM   Specimen: BLOOD  Result Value Ref Range Status   Specimen Description   Final    BLOOD LEFT ASSIST CONTROL Performed at [facility], [address], [place], [place] [address]    Special Requests   Final    BOTTLES DRAWN AEROBIC AND ANAEROBIC Blood Culture results may not be optimal due to an inadequate volume of blood received in culture bottles Performed at [facility], [address] [place], [place] [address]    Culture  Setup Time    Final    GRAM NEGATIVE RODS AEROBIC BOTTLE ONLY CRITICAL RESULT CALLED TO, READ BACK BY AND VERIFIED WITH: SHEEMA HALLAJI ON 08/04/23 AT 1304 QSD    Culture   Final    GRAM NEGATIVE RODS IDENTIFICATION TO FOLLOW Performed at [facility], [address], [place], [place] [address]    Report Status PENDING  Incomplete  Blood Culture ID Panel (Reflexed)     Status: None   Collection Time: 08/02/23  9:12 PM  Result Value Ref Range Status   Enterococcus faecalis NOT DETECTED NOT DETECTED Final   Enterococcus Faecium NOT DETECTED NOT DETECTED Final   Listeria monocytogenes NOT DETECTED NOT DETECTED Final   Staphylococcus species NOT DETECTED NOT DETECTED Final   Staphylococcus aureus (BCID) NOT  DETECTED NOT DETECTED Final   Staphylococcus epidermidis NOT DETECTED NOT DETECTED Final   Staphylococcus lugdunensis NOT DETECTED NOT DETECTED Final   Streptococcus species NOT DETECTED NOT DETECTED Final   Streptococcus agalactiae NOT DETECTED NOT DETECTED Final   Streptococcus pneumoniae NOT DETECTED NOT DETECTED Final   Streptococcus pyogenes NOT DETECTED NOT DETECTED Final   A.calcoaceticus-baumannii NOT DETECTED NOT DETECTED Final   Bacteroides fragilis NOT DETECTED NOT DETECTED Final   Enterobacterales NOT DETECTED NOT DETECTED Final   Enterobacter cloacae complex NOT DETECTED NOT DETECTED Final   Escherichia coli NOT DETECTED NOT DETECTED Final   Klebsiella aerogenes NOT DETECTED NOT DETECTED Final   Klebsiella oxytoca NOT DETECTED NOT DETECTED Final   Klebsiella pneumoniae NOT DETECTED NOT DETECTED Final   Proteus species NOT DETECTED NOT DETECTED Final   Salmonella species NOT DETECTED NOT DETECTED Final   Serratia marcescens NOT DETECTED NOT DETECTED Final   Haemophilus influenzae NOT DETECTED NOT DETECTED Final   Neisseria meningitidis NOT DETECTED NOT DETECTED Final   Pseudomonas aeruginosa NOT DETECTED NOT DETECTED Final   Stenotrophomonas maltophilia NOT DETECTED NOT  DETECTED Final   Candida albicans NOT DETECTED NOT DETECTED Final   Candida auris NOT DETECTED NOT DETECTED Final   Candida glabrata NOT DETECTED NOT DETECTED Final   Candida krusei NOT DETECTED NOT DETECTED Final   Candida parapsilosis NOT DETECTED NOT DETECTED Final   Candida tropicalis NOT DETECTED NOT DETECTED Final   Cryptococcus neoformans/gattii NOT DETECTED NOT DETECTED Final    Comment: Performed at [facility], [address] [place], [place] [address]  Respiratory (~20 pathogens) panel by PCR     Status: Abnormal   Collection Time: 08/03/23  9:27 PM   Specimen: Nasopharyngeal Swab; Respiratory  Result Value Ref Range Status   Adenovirus NOT DETECTED NOT DETECTED Final   Coronavirus 229E NOT DETECTED NOT DETECTED Final    Comment: (NOTE) The Coronavirus on the Respiratory Panel, DOES NOT test for the novel  Coronavirus (2019 nCoV)    Coronavirus HKU1 NOT DETECTED NOT DETECTED Final   Coronavirus NL63 NOT DETECTED NOT DETECTED Final   Coronavirus OC43 NOT DETECTED NOT DETECTED Final   Metapneumovirus NOT DETECTED NOT DETECTED Final   Rhinovirus / Enterovirus NOT DETECTED NOT DETECTED Final   Influenza A H1 2009 DETECTED (A) NOT DETECTED Final   Influenza B NOT DETECTED NOT DETECTED Final   Parainfluenza Virus 1 NOT DETECTED NOT DETECTED Final   Parainfluenza Virus 2 NOT DETECTED NOT DETECTED Final   Parainfluenza Virus 3 NOT DETECTED NOT DETECTED Final   Parainfluenza Virus 4 NOT DETECTED NOT DETECTED Final   Respiratory Syncytial Virus NOT DETECTED NOT DETECTED Final   Bordetella pertussis NOT DETECTED NOT DETECTED Final   Bordetella Parapertussis NOT DETECTED NOT DETECTED Final   Chlamydophila pneumoniae NOT DETECTED NOT DETECTED Final   Mycoplasma pneumoniae NOT DETECTED NOT DETECTED Final    Comment: Performed at [facility], [address] [place], [place] [address]    Labs: CBC: Recent Labs  Lab 08/02/23 1705 08/03/23 0510  WBC 6.2 8.1   NEUTROABS 5.1  --   HGB 14.4 13.1  HCT 42.4 39.5  MCV 95.7 98.0  PLT 175 149*   Basic Metabolic Panel: Recent Labs  Lab 08/02/23 1705 08/03/23 0510  NA 141 140  K 3.9 4.1  CL 106 103  CO2 24 27  GLUCOSE 147* 130*  BUN 23 23  CREATININE 0.88 0.95  CALCIUM 8.8* 8.0*   Liver  Function Tests: Recent Labs  Lab 08/02/23 1705  AST 31  ALT 21  ALKPHOS 58  BILITOT 0.5  PROT 7.0  ALBUMIN 4.1   CBG: No results for input(s): "GLUCAP" in the last 168 hours.  Discharge time spent: greater than 30 minutes.  This record has been created using Conservation officer, historic buildings. Errors have been sought and corrected,but may not always be located. Such creation errors do not reflect on the standard of care.   Signed: Arnetha Courser, MD [facility] 08/06/2023

## 2023-08-07 LAB — CULTURE, BLOOD (ROUTINE X 2): Culture: NO GROWTH

## 2023-08-08 LAB — CULTURE, BLOOD (ROUTINE X 2)
